# Patient Record
Sex: Male | Born: 1970 | Race: Black or African American | Hispanic: No | Marital: Married | Smoking: Former smoker
Health system: Southern US, Community
[De-identification: ages and names within clinical notes are randomized; demographics above are authoritative.]

## PROBLEM LIST (undated history)

## (undated) DIAGNOSIS — G4730 Sleep apnea, unspecified: Secondary | ICD-10-CM

## (undated) DIAGNOSIS — S8254XD Nondisplaced fracture of medial malleolus of right tibia, subsequent encounter for closed fracture with routine healing: Secondary | ICD-10-CM

## (undated) DIAGNOSIS — M1990 Unspecified osteoarthritis, unspecified site: Secondary | ICD-10-CM

## (undated) DIAGNOSIS — K219 Gastro-esophageal reflux disease without esophagitis: Secondary | ICD-10-CM

## (undated) HISTORY — PX: HAND SURGERY: SHX662

## (undated) HISTORY — PX: EYE SURGERY: SHX253

---

## 2012-04-07 ENCOUNTER — Encounter: Payer: Self-pay | Admitting: Family Medicine

## 2012-04-07 ENCOUNTER — Ambulatory Visit (INDEPENDENT_AMBULATORY_CARE_PROVIDER_SITE_OTHER): Payer: Managed Care, Other (non HMO) | Admitting: Family Medicine

## 2012-04-07 VITALS — BP 132/89 | HR 68 | Temp 97.8°F | Ht 74.0 in | Wt 220.0 lb

## 2012-04-07 DIAGNOSIS — M25579 Pain in unspecified ankle and joints of unspecified foot: Secondary | ICD-10-CM

## 2012-04-07 DIAGNOSIS — M25571 Pain in right ankle and joints of right foot: Secondary | ICD-10-CM | POA: Insufficient documentation

## 2012-04-07 DIAGNOSIS — M25559 Pain in unspecified hip: Secondary | ICD-10-CM

## 2012-04-07 DIAGNOSIS — M25552 Pain in left hip: Secondary | ICD-10-CM

## 2012-04-07 MED ORDER — MELOXICAM 15 MG PO TABS: 15.0000 mg | ORAL_TABLET | Freq: Every day | ORAL | Status: AC

## 2012-04-07 NOTE — Assessment & Plan Note (Signed)
2/2 mild trochanteric bursitis.  Start meloxicam.  Believe once issue #1 is addressed this should improve as well.  Otherwise consider injection, PT or home exercise program.

## 2012-04-07 NOTE — Patient Instructions (Addendum)
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 Treatment for this involves specific strengthening exercises (turning foot in, calf raises - 3 sets of 10 once a day). Also arch support to prevent pronation helps to rest this. Icing 15 minutes at a time 3-4 times a day. Meloxicam 15mg  daily with food for pain and inflammation. Laceup ankle brace may help with support. These things should fix your mechanics and allow your left hip pain to resolve. You're developing mild bursitis of your left hip as a result of walking differently. Follow up with me in 6 weeks for reevaluation.

## 2012-04-07 NOTE — Assessment & Plan Note (Signed)
2/2 post tibial tendinopathy - start home exercise program.  Discussed also buying OTC orthotics - going to consider this vs the ones we stock here.  Start meloxicam, icing.  Tried on ASO but believes he has one at home.  See instructions for further.

## 2012-04-07 NOTE — Progress Notes (Signed)
Subjective:    Patient ID: Chad Hernandez, male    DOB: 30-Apr-1971, 41 y.o.   MRN: [id number]  PCP: [facility]  HPI 41 yo M here for right foot/ankle and left hip pain.  1. Right foot/ankle pain  No known injury though has played basketball for years. States started having medial right ankle pain about a year ago with intermittent swelling. Worse with certain ankle motions. Pain comes and goes - radiates to anterior ankle. Has not tried PT. Has taking tramadol, occasional aleve. Not tried an ankle brace. Decreased motion of ankle. States he's had x-rays before but is unsure what they showed.  2. Left hip pain Started only recently - pain on left lateral thigh/hip especially when lying on this side. Occasional swelling. No bruising. No hip injury. Mild low back pain with this. No numbness, tingling. No radiation past knee. No bowel/bladder dysfunction. Worse when on feet a lot.  History reviewed. No pertinent past medical history.  No current outpatient prescriptions on file prior to visit.    Past Surgical History  Procedure Date  . Hand surgery   . Eye surgery     No Known Allergies  History   Social History  . Marital Status: Single    Spouse Name: N/A    Number of Children: N/A  . Years of Education: N/A   Occupational History  . Not on file.   Social History Main Topics  . Smoking status: Never Smoker   . Smokeless tobacco: Not on file  . Alcohol Use: Not on file  . Drug Use: Not on file  . Sexually Active: Not on file   Other Topics Concern  . Not on file   Social History Narrative  . No narrative on file    Family History  Problem Relation Age of Onset  . Hyperlipidemia Mother   . Hypertension Mother   . Diabetes Neg Hx   . Heart attack Neg Hx   . Sudden death Neg Hx     BP 132/89  Pulse 68  Temp 97.8 F (36.6 C) (Oral)  Ht 6\' 2"  (1.88 m)  Wt 220 lb (99.791 kg)  BMI 28.25 kg/m2  Review of Systems See HPI above.      Objective:   Physical Exam Gen: NAD  R ankle: No gross deformity, swelling, ecchymoses Mild-mod limitation in dorsiflexion.  Otherwise FROM. TTP posterior and inferior to medial malleolus.  No fibular head, navicular, base 5th, medial/lateral malleolus, or other TTP. Negative ant drawer and talar tilt.   Negative syndesmotic compression. Thompsons test negative. NV intact distally. Mild long arch collapse but mostly maintained.  Back/L hip: No gross deformity, scoliosis. TTP mild left greater trochanter and lumbar paraspinal region.  No midline or bony TTP.  No  Right sided TTP. FROM without pain of hip or back. Strength LEs 5/5 all muscle groups.   1+ MSRs in patellar and achilles tendons, equal bilaterally. Negative SLRs. Sensation intact to light touch bilaterally. Negative logroll bilateral hips Negative fabers and piriformis stretches. No leg length inequality. 5/5 hip abduction strength.    Assessment & Plan:  1. Right ankle pain - 2/2 post tibial tendinopathy - start home exercise program.  Discussed also buying OTC orthotics - going to consider this vs the ones we stock here.  Start meloxicam, icing.  Tried on ASO but believes he has one at home.  See instructions for further.  2. Left hip pain - 2/2 mild trochanteric bursitis.  Start meloxicam.  Believe once issue #1 is addressed this should improve as well.  Otherwise consider injection, PT or home exercise program.

## 2012-05-19 ENCOUNTER — Ambulatory Visit: Payer: Managed Care, Other (non HMO) | Admitting: Family Medicine

## 2012-12-22 ENCOUNTER — Ambulatory Visit (INDEPENDENT_AMBULATORY_CARE_PROVIDER_SITE_OTHER): Payer: Managed Care, Other (non HMO) | Admitting: Internal Medicine

## 2012-12-22 ENCOUNTER — Other Ambulatory Visit (INDEPENDENT_AMBULATORY_CARE_PROVIDER_SITE_OTHER): Payer: Managed Care, Other (non HMO)

## 2012-12-22 ENCOUNTER — Encounter: Payer: Self-pay | Admitting: Internal Medicine

## 2012-12-22 VITALS — BP 110/80 | HR 62 | Temp 97.5°F | Resp 16 | Ht 74.0 in | Wt 227.0 lb

## 2012-12-22 DIAGNOSIS — Z0000 Encounter for general adult medical examination without abnormal findings: Secondary | ICD-10-CM

## 2012-12-22 DIAGNOSIS — Z114 Encounter for screening for human immunodeficiency virus [HIV]: Secondary | ICD-10-CM | POA: Insufficient documentation

## 2012-12-22 LAB — TSH: TSH: 1.41 u[IU]/mL (ref 0.35–5.50)

## 2012-12-22 LAB — COMPREHENSIVE METABOLIC PANEL
BUN: 12 mg/dL (ref 6–23)
CO2: 26 mEq/L (ref 19–32)
Creatinine, Ser: 1.1 mg/dL (ref 0.4–1.5)
GFR: 91.72 mL/min (ref >60.00)
Glucose, Bld: 88 mg/dL (ref 70–99)
Sodium: 137 mEq/L (ref 135–145)
Total Bilirubin: 1 mg/dL (ref 0.3–1.2)
Total Protein: 7.5 g/dL (ref 6.0–8.3)

## 2012-12-22 LAB — CBC WITH DIFFERENTIAL/PLATELET
Basophils Relative: 0.3 % (ref 0.0–3.0)
Eosinophils Relative: 2 % (ref 0.0–5.0)
HCT: 44.9 % (ref 39.0–52.0)
Lymphs Abs: 1.9 10*3/uL (ref 0.7–4.0)
Monocytes Relative: 11.3 % (ref 3.0–12.0)
Platelets: 228 10*3/uL (ref 150.0–400.0)
RBC: 4.93 Mil/uL (ref 4.22–5.81)
WBC: 6.4 10*3/uL (ref 4.5–10.5)

## 2012-12-22 LAB — LIPID PANEL
LDL Cholesterol: 88 mg/dL (ref 0–99)
Total CHOL/HDL Ratio: 4
Triglycerides: 49 mg/dL (ref 0.0–149.0)
VLDL: 9.8 mg/dL (ref 0.0–40.0)

## 2012-12-22 LAB — URINALYSIS, ROUTINE W REFLEX MICROSCOPIC
Bilirubin Urine: NEGATIVE
Ketones, ur: NEGATIVE
Leukocytes, UA: NEGATIVE
Urine Glucose: NEGATIVE
pH: 6 (ref 5.0–8.0)

## 2012-12-22 NOTE — Assessment & Plan Note (Signed)
Exam done Vaccines were reviewed Labs ordered Pt ed material was given 

## 2012-12-22 NOTE — Patient Instructions (Signed)
Health Maintenance, Males A healthy lifestyle and preventative care can promote health and wellness.  Maintain regular health, dental, and eye exams.  Eat a healthy diet. Foods like vegetables, fruits, whole grains, low-fat dairy products, and lean protein foods contain the nutrients you need without too many calories. Decrease your intake of foods high in solid fats, added sugars, and salt. Get information about a proper diet from your caregiver, if necessary.  Regular physical exercise is one of the most important things you can do for your health. Most adults should get at least 150 minutes of moderate-intensity exercise (any activity that increases your heart rate and causes you to sweat) each week. In addition, most adults need muscle-strengthening exercises on 2 or more days a week.   Maintain a healthy weight. The body mass index (BMI) is a screening tool to identify possible weight problems. It provides an estimate of body fat based on height and weight. Your caregiver can help determine your BMI, and can help you achieve or maintain a healthy weight. For adults 20 years and older:  A BMI below 18.5 is considered underweight.  A BMI of 18.5 to 24.9 is normal.  A BMI of 25 to 29.9 is considered overweight.  A BMI of 30 and above is considered obese.  Maintain normal blood lipids and cholesterol by exercising and minimizing your intake of saturated fat. Eat a balanced diet with plenty of fruits and vegetables. Blood tests for lipids and cholesterol should begin at age 20 and be repeated every 5 years. If your lipid or cholesterol levels are high, you are over 50, or you are a high risk for heart disease, you may need your cholesterol levels checked more frequently.Ongoing high lipid and cholesterol levels should be treated with medicines, if diet and exercise are not effective.  If you smoke, find out from your caregiver how to quit. If you do not use tobacco, do not start.  If you  choose to drink alcohol, do not exceed 2 drinks per day. One drink is considered to be 12 ounces (355 mL) of beer, 5 ounces (148 mL) of wine, or 1.5 ounces (44 mL) of liquor.  Avoid use of street drugs. Do not share needles with anyone. Ask for help if you need support or instructions about stopping the use of drugs.  High blood pressure causes heart disease and increases the risk of stroke. Blood pressure should be checked at least every 1 to 2 years. Ongoing high blood pressure should be treated with medicines if weight loss and exercise are not effective.  If you are 45 to 44 years old, ask your caregiver if you should take aspirin to prevent heart disease.  Diabetes screening involves taking a blood sample to check your fasting blood sugar level. This should be done once every 3 years, after age 45, if you are within normal weight and without risk factors for diabetes. Testing should be considered at a younger age or be carried out more frequently if you are overweight and have at least 1 risk factor for diabetes.  Colorectal cancer can be detected and often prevented. Most routine colorectal cancer screening begins at the age of 50 and continues through age 75. However, your caregiver may recommend screening at an earlier age if you have risk factors for colon cancer. On a yearly basis, your caregiver may provide home test kits to check for hidden blood in the stool. Use of a small camera at the end of a tube,   to directly examine the colon (sigmoidoscopy or colonoscopy), can detect the earliest forms of colorectal cancer. Talk to your caregiver about this at age 50, when routine screening begins. Direct examination of the colon should be repeated every 5 to 10 years through age 75, unless early forms of pre-cancerous polyps or small growths are found.  Hepatitis C blood testing is recommended for all people born from 1945 through 1965 and any individual with known risks for hepatitis C.  Healthy  men should no longer receive prostate-specific antigen (PSA) blood tests as part of routine cancer screening. Consult with your caregiver about prostate cancer screening.  Testicular cancer screening is not recommended for adolescents or adult males who have no symptoms. Screening includes self-exam, caregiver exam, and other screening tests. Consult with your caregiver about any symptoms you have or any concerns you have about testicular cancer.  Practice safe sex. Use condoms and avoid high-risk sexual practices to reduce the spread of sexually transmitted infections (STIs).  Use sunscreen with a sun protection factor (SPF) of 30 or greater. Apply sunscreen liberally and repeatedly throughout the day. You should seek shade when your shadow is shorter than you. Protect yourself by wearing long sleeves, pants, a wide-brimmed hat, and sunglasses year round, whenever you are outdoors.  Notify your caregiver of new moles or changes in moles, especially if there is a change in shape or color. Also notify your caregiver if a mole is larger than the size of a pencil eraser.  A one-time screening for abdominal aortic aneurysm (AAA) and surgical repair of large AAAs by sound wave imaging (ultrasonography) is recommended for ages 65 to 75 years who are current or former smokers.  Stay current with your immunizations. Document Released: 02/26/2008 Document Revised: 11/22/2011 Document Reviewed: 01/25/2011 [employer] Patient Information 2013 [employer].  

## 2012-12-22 NOTE — Progress Notes (Signed)
  Subjective:    Patient ID: Chad Hernandez, male    DOB: Jan 11, 1971, 42 y.o.   MRN: [id number]  HPI  New to me for a physical, he feels well and offers no complaints.   Review of Systems  Constitutional: Negative.   HENT: Negative.   Eyes: Negative.   Respiratory: Negative.   Cardiovascular: Negative.   Gastrointestinal: Negative.   Endocrine: Negative.   Genitourinary: Negative.   Musculoskeletal: Negative.   Skin: Negative.   Allergic/Immunologic: Negative.   Neurological: Negative.   Hematological: Negative.   Psychiatric/Behavioral: Negative.        Objective:   Physical Exam  Vitals reviewed. Constitutional: He is oriented to person, place, and time. He appears well-developed and well-nourished. No distress.  HENT:  Head: Normocephalic and atraumatic.  Mouth/Throat: Oropharynx is clear and moist. No oropharyngeal exudate.  Eyes: Conjunctivae are normal. Right eye exhibits no discharge. Left eye exhibits no discharge. No scleral icterus.  Neck: Normal range of motion. Neck supple. No JVD present. No tracheal deviation present. No thyromegaly present.  Cardiovascular: Normal rate, regular rhythm, normal heart sounds and intact distal pulses.  Exam reveals no gallop and no friction rub.   No murmur heard. Pulmonary/Chest: Effort normal and breath sounds normal. No stridor. No respiratory distress. He has no wheezes. He has no rales. He exhibits no tenderness.  Abdominal: Soft. Bowel sounds are normal. He exhibits no distension and no mass. There is no tenderness. There is no rebound and no guarding. Hernia confirmed negative in the right inguinal area and confirmed negative in the left inguinal area.  Genitourinary: Rectum normal, prostate normal, testes normal and penis normal. Rectal exam shows no external hemorrhoid, no internal hemorrhoid, no fissure, no mass, no tenderness and anal tone normal. Guaiac negative stool. Prostate is not enlarged and not tender. Right  testis shows no mass, no swelling and no tenderness. Right testis is descended. Left testis shows no mass, no swelling and no tenderness. Left testis is descended. Circumcised. No penile erythema or penile tenderness. No discharge found.  Musculoskeletal: Normal range of motion. He exhibits no edema and no tenderness.  Lymphadenopathy:    He has no cervical adenopathy.       Right: No inguinal adenopathy present.       Left: No inguinal adenopathy present.  Neurological: He is oriented to person, place, and time.  Skin: Skin is warm and dry. No rash noted. He is not diaphoretic. No erythema. No pallor.  Psychiatric: He has a normal mood and affect. His behavior is normal. Judgment and thought content normal.     No results found for this basename: WBC, HGB, HCT, PLT, GLUCOSE, CHOL, TRIG, HDL, LDLDIRECT, LDLCALC, ALT, AST, NA, K, CL, CREATININE, BUN, CO2, TSH, PSA, INR, GLUF, HGBA1C, MICROALBUR       Assessment & Plan:

## 2012-12-25 ENCOUNTER — Encounter: Payer: Self-pay | Admitting: Internal Medicine

## 2013-03-28 ENCOUNTER — Encounter: Payer: Self-pay | Admitting: Internal Medicine

## 2013-03-28 ENCOUNTER — Ambulatory Visit (INDEPENDENT_AMBULATORY_CARE_PROVIDER_SITE_OTHER): Payer: Managed Care, Other (non HMO) | Admitting: Internal Medicine

## 2013-03-28 ENCOUNTER — Ambulatory Visit (INDEPENDENT_AMBULATORY_CARE_PROVIDER_SITE_OTHER)
Admission: RE | Admit: 2013-03-28 | Discharge: 2013-03-28 | Disposition: A | Discharge status: Home / Self Care | Payer: Managed Care, Other (non HMO) | Source: Ambulatory Visit | Attending: Internal Medicine | Admitting: Internal Medicine

## 2013-03-28 VITALS — BP 130/76 | HR 73 | Temp 97.5°F | Resp 16 | Wt 220.0 lb

## 2013-03-28 DIAGNOSIS — M25579 Pain in unspecified ankle and joints of unspecified foot: Secondary | ICD-10-CM

## 2013-03-28 DIAGNOSIS — M24873 Other specific joint derangements of unspecified ankle, not elsewhere classified: Secondary | ICD-10-CM

## 2013-03-28 DIAGNOSIS — M25373 Other instability, unspecified ankle: Secondary | ICD-10-CM | POA: Insufficient documentation

## 2013-03-28 DIAGNOSIS — M25371 Other instability, right ankle: Secondary | ICD-10-CM

## 2013-03-28 DIAGNOSIS — M19079 Primary osteoarthritis, unspecified ankle and foot: Secondary | ICD-10-CM | POA: Insufficient documentation

## 2013-03-28 DIAGNOSIS — M25571 Pain in right ankle and joints of right foot: Secondary | ICD-10-CM

## 2013-03-28 NOTE — Patient Instructions (Signed)

## 2013-03-28 NOTE — Progress Notes (Signed)
  Subjective:    Patient ID: Chad Hernandez, male    DOB: October 19, 1970, 42 y.o.   MRN: [id number]  Ankle Pain  Incident onset: ankle pain off and on for 1 year, he does not remember an injury. The incident occurred at home. There was no injury mechanism. The pain is present in the right ankle. The quality of the pain is described as aching. The pain is at a severity of 2/10. The pain is mild. The pain has been worsening since onset. Pertinent negatives include no inability to bear weight, loss of motion, loss of sensation, muscle weakness, numbness or tingling. He reports no foreign bodies present. The symptoms are aggravated by weight bearing and movement. He has tried NSAIDs for the symptoms. The treatment provided mild relief.      Review of Systems  Constitutional: Negative.   HENT: Negative.   Eyes: Negative.   Respiratory: Negative.   Endocrine: Negative.   Genitourinary: Negative.   Musculoskeletal: Positive for arthralgias.  Skin: Negative.   Allergic/Immunologic: Negative.   Neurological: Negative.  Negative for tingling and numbness.  Hematological: Negative.   Psychiatric/Behavioral: Negative.        Objective:   Physical Exam  Musculoskeletal:       Right ankle: He exhibits normal range of motion, no swelling, no ecchymosis, no deformity, no laceration and normal pulse. Tenderness (over the anterior, medial talus area). No lateral malleolus, no medial malleolus, no AITFL, no CF ligament, no posterior TFL, no head of 5th metatarsal and no proximal fibula tenderness found. Achilles tendon exhibits no pain, no defect and normal Thompson's test results.     Lab Results  Component Value Date   WBC 6.4 12/22/2012   HGB 15.4 12/22/2012   HCT 44.9 12/22/2012   PLT 228.0 12/22/2012   GLUCOSE 88 12/22/2012   CHOL 129 12/22/2012   TRIG 49.0 12/22/2012   HDL 31.10* 12/22/2012   LDLCALC 88 12/22/2012   ALT 29 12/22/2012   AST 23 12/22/2012   NA 137 12/22/2012   K 4.5 12/22/2012   CL  104 12/22/2012   CREATININE 1.1 12/22/2012   BUN 12 12/22/2012   CO2 26 12/22/2012   TSH 1.41 12/22/2012   PSA 0.66 12/22/2012       Assessment & Plan:

## 2013-03-30 ENCOUNTER — Encounter: Payer: Self-pay | Admitting: Internal Medicine

## 2013-03-30 NOTE — Assessment & Plan Note (Signed)
Ortho referral  

## 2013-03-30 NOTE — Assessment & Plan Note (Signed)
Continue nsaids Ortho referral

## 2013-07-19 ENCOUNTER — Other Ambulatory Visit: Payer: Self-pay

## 2013-12-24 ENCOUNTER — Encounter: Payer: Managed Care, Other (non HMO) | Admitting: Internal Medicine

## 2013-12-24 DIAGNOSIS — Z0289 Encounter for other administrative examinations: Secondary | ICD-10-CM

## 2014-06-28 ENCOUNTER — Other Ambulatory Visit: Payer: Self-pay

## 2014-09-27 ENCOUNTER — Encounter: Payer: Managed Care, Other (non HMO) | Admitting: Internal Medicine

## 2014-10-02 ENCOUNTER — Encounter: Payer: Managed Care, Other (non HMO) | Admitting: Internal Medicine

## 2018-09-18 ENCOUNTER — Encounter: Payer: Self-pay | Admitting: Family Medicine

## 2018-09-18 ENCOUNTER — Ambulatory Visit: Payer: Managed Care, Other (non HMO) | Admitting: Family Medicine

## 2018-09-18 ENCOUNTER — Ambulatory Visit (INDEPENDENT_AMBULATORY_CARE_PROVIDER_SITE_OTHER): Payer: BLUE CROSS/BLUE SHIELD | Admitting: Family Medicine

## 2018-09-18 ENCOUNTER — Ambulatory Visit (INDEPENDENT_AMBULATORY_CARE_PROVIDER_SITE_OTHER): Payer: BLUE CROSS/BLUE SHIELD

## 2018-09-18 VITALS — BP 130/78 | HR 97 | Ht 74.0 in | Wt 238.0 lb

## 2018-09-18 DIAGNOSIS — M25571 Pain in right ankle and joints of right foot: Secondary | ICD-10-CM

## 2018-09-18 DIAGNOSIS — Z1211 Encounter for screening for malignant neoplasm of colon: Secondary | ICD-10-CM

## 2018-09-18 DIAGNOSIS — G8929 Other chronic pain: Secondary | ICD-10-CM

## 2018-09-18 DIAGNOSIS — Z0001 Encounter for general adult medical examination with abnormal findings: Secondary | ICD-10-CM | POA: Diagnosis not present

## 2018-09-18 DIAGNOSIS — K2900 Acute gastritis without bleeding: Secondary | ICD-10-CM | POA: Diagnosis not present

## 2018-09-18 DIAGNOSIS — R0683 Snoring: Secondary | ICD-10-CM

## 2018-09-18 MED ORDER — PANTOPRAZOLE SODIUM 40 MG PO TBEC: 40.0000 mg | DELAYED_RELEASE_TABLET | Freq: Every day | ORAL | 3 refills | Status: DC

## 2018-09-18 NOTE — Progress Notes (Signed)
Established Patient Office Visit  Subjective:  Patient ID: Chad Hernandez, male    DOB: [birth year]  Age: 48 y.o. MRN: [id number]  CC:  Chief Complaint  Patient presents with  . Establish Care    HPI Chad Hernandez presents for a complete physical exam and to discuss some medical issues and concerns.  He is accompanied by his wife.  Patient is nonfasting today.  He is a Production designer, theatre/television/film at [employer].  He is on his feet for up to 8 hours daily.  He deals with chronic right ankle pain for years now.  Denies instability.  It sounds as though a fusion has been recommended in his past.  He takes nonsteroidals for his pain on a regular basis.  He does deal with indigestion and reflux.  This is been going on for about 4 months now.  He does have some solid food dysphagia.  Denies increased burping melena or bleeding per rectum.  He does not smoke or use illicit drugs.  Rarely drinks.  He was an athlete in his younger years and played basketball.  Mom died of age 49 from breast cancer.  Father's health history is unknown.  Patient's urine flow is good.  Patient snores loudly and stops breathing according to his wife.  He he feels rested in the morning but does have significant daytime sleepiness.  History reviewed. No pertinent past medical history.  Past Surgical History:  Procedure Laterality Date  . EYE SURGERY    . HAND SURGERY      Family History  Problem Relation Age of Onset  . Hyperlipidemia Mother   . Hypertension Mother   . Diabetes Neg Hx   . Heart attack Neg Hx   . Sudden death Neg Hx   . Cancer Neg Hx   . COPD Neg Hx   . Early death Neg Hx   . Hearing loss Neg Hx   . Heart disease Neg Hx   . Kidney disease Neg Hx   . Stroke Neg Hx   . Hypertension Brother     Social History   Socioeconomic History  . Marital status: Single    Spouse name: Not on file  . Number of children: Not on file  . Years of education: Not on file  . Highest education level: Not on file    Occupational History  . Not on file  Social Needs  . Financial resource strain: Not on file  . Food insecurity:    Worry: Not on file    Inability: Not on file  . Transportation needs:    Medical: Not on file    Non-medical: Not on file  Tobacco Use  . Smoking status: Never Smoker  . Smokeless tobacco: Never Used  Substance and Sexual Activity  . Alcohol use: Yes    Alcohol/week: 3.0 standard drinks    Types: 3 Cans of beer per week  . Drug use: No  . Sexual activity: Yes  Lifestyle  . Physical activity:    Days per week: Not on file    Minutes per session: Not on file  . Stress: Not on file  Relationships  . Social connections:    Talks on phone: Not on file    Gets together: Not on file    Attends religious service: Not on file    Active member of club or organization: Not on file    Attends meetings of clubs or organizations: Not on file    Relationship status:  Not on file  . Intimate partner violence:    Fear of current or ex partner: Not on file    Emotionally abused: Not on file    Physically abused: Not on file    Forced sexual activity: Not on file  Other Topics Concern  . Not on file  Social History Narrative  . Not on file    No outpatient medications prior to visit.   No facility-administered medications prior to visit.     No Known Allergies  ROS Review of Systems  Constitutional: Negative for chills, diaphoresis, fatigue, fever and unexpected weight change.  HENT: Negative.   Eyes: Negative for photophobia and visual disturbance.  Respiratory: Positive for apnea. Negative for shortness of breath and wheezing.   Cardiovascular: Negative.  Negative for chest pain.  Gastrointestinal: Negative.   Endocrine: Negative for polyphagia and polyuria.  Genitourinary: Negative for difficulty urinating, frequency and urgency.  Musculoskeletal: Positive for arthralgias and gait problem.  Skin: Negative for pallor and rash.  Allergic/Immunologic: Negative  for immunocompromised state.  Neurological: Negative for light-headedness and headaches.  Hematological: Does not bruise/bleed easily.  Psychiatric/Behavioral: Negative.       Objective:    Physical Exam  Constitutional: He is oriented to person, place, and time. He appears well-developed and well-nourished. No distress.  HENT:  Head: Normocephalic and atraumatic.  Right Ear: External ear normal.  Left Ear: External ear normal.  Mouth/Throat: Oropharynx is clear and moist. No oropharyngeal exudate.    Eyes: Pupils are equal, round, and reactive to light. Conjunctivae are normal. Right eye exhibits no discharge. Left eye exhibits no discharge. No scleral icterus.  Neck: Neck supple. No JVD present. No tracheal deviation present. No thyromegaly present.  Cardiovascular: Normal rate, regular rhythm and normal heart sounds.  Pulmonary/Chest: Effort normal and breath sounds normal. No stridor.  Abdominal: Soft. Bowel sounds are normal. He exhibits no distension. There is no abdominal tenderness. There is no rebound and no guarding.  Genitourinary: Rectum:     Guaiac result negative.     No rectal mass, anal fissure, tenderness, external hemorrhoid, internal hemorrhoid or abnormal anal tone.  Prostate is enlarged. Prostate is not tender.  Musculoskeletal:     Right ankle: He exhibits decreased range of motion and swelling.       Feet:  Lymphadenopathy:    He has no cervical adenopathy.  Neurological: He is alert and oriented to person, place, and time.  Skin: Skin is warm and dry. He is not diaphoretic.  Psychiatric: He has a normal mood and affect. His behavior is normal.    BP 130/78   Pulse 97   Ht 6\' 2"  (1.88 m)   Wt 238 lb (108 kg)   SpO2 97%   BMI 30.56 kg/m  Wt Readings from Last 3 Encounters:  09/18/18 238 lb (108 kg)  03/28/13 220 lb (99.8 kg)  12/22/12 227 lb (103 kg)   BP Readings from Last 3 Encounters:  09/18/18 130/78  03/28/13 130/76  12/22/12 110/80    Guideline developer:  UpToDate (see UpToDate for funding source) Date Released: June 2014  Health Maintenance Due  Topic Date Due  . HIV Screening  04/06/1984  . TETANUS/TDAP  03/07/2017  . INFLUENZA VACCINE  04/13/2018    There are no preventive care reminders to display for this patient.  Lab Results  Component Value Date   TSH 1.41 12/22/2012   Lab Results  Component Value Date   WBC 6.4 12/22/2012   HGB  15.4 12/22/2012   HCT 44.9 12/22/2012   MCV 91.2 12/22/2012   PLT 228.0 12/22/2012   Lab Results  Component Value Date   NA 137 12/22/2012   K 4.5 12/22/2012   CO2 26 12/22/2012   GLUCOSE 88 12/22/2012   BUN 12 12/22/2012   CREATININE 1.1 12/22/2012   BILITOT 1.0 12/22/2012   ALKPHOS 81 12/22/2012   AST 23 12/22/2012   ALT 29 12/22/2012   PROT 7.5 12/22/2012   ALBUMIN 4.4 12/22/2012   CALCIUM 9.1 12/22/2012   GFR 91.72 12/22/2012   Lab Results  Component Value Date   CHOL 129 12/22/2012   Lab Results  Component Value Date   HDL 31.10 (L) 12/22/2012   Lab Results  Component Value Date   LDLCALC 88 12/22/2012   Lab Results  Component Value Date   TRIG 49.0 12/22/2012   Lab Results  Component Value Date   CHOLHDL 4 12/22/2012   No results found for: HGBA1C    Assessment & Plan:   Problem List Items Addressed This Visit    None    Visit Diagnoses    Encounter for health maintenance examination with abnormal findings    -  Primary   Relevant Orders   CBC   Comprehensive metabolic panel   Lipid panel   PSA   TSH   Urinalysis, Routine w reflex microscopic   Acute gastritis without hemorrhage, unspecified gastritis type       Relevant Medications   pantoprazole (PROTONIX) 40 MG tablet   Other Relevant Orders   Ambulatory referral to Gastroenterology   Snores       Relevant Orders   Ambulatory referral to Sleep Studies   Chronic pain of right ankle       Relevant Orders   Ambulatory referral to Sports Medicine   DG Ankle Complete  Right   Screen for colon cancer       Relevant Orders   Ambulatory referral to Gastroenterology      Meds ordered this encounter  Medications  . pantoprazole (PROTONIX) 40 MG tablet    Sig: Take 1 tablet (40 mg total) by mouth daily.    Dispense:  30 tablet    Refill:  3    Follow-up: Return in about 3 months (around 12/18/2018), or return fasting for blood work. .   Patient agrees to seeing sports medicine for possible insert and referral to orthopedist for arthroscopy of his right ankle.  We will go ahead and start Protonix for what is probably an NSAID induced gastritis.  Go ahead and refer for colonoscopy with EGD.  Sleep study for what sounds like his apnea.  He will return fasting for above ordered blood work.

## 2018-09-18 NOTE — Patient Instructions (Addendum)
Health Maintenance, Male A healthy lifestyle and preventive care is important for your health and wellness. Ask your health care provider about what schedule of regular examinations is right for you. What should I know about weight and diet? Eat a Healthy Diet  Eat plenty of vegetables, fruits, whole grains, low-fat dairy products, and lean protein.  Do not eat a lot of foods high in solid fats, added sugars, or salt.  Maintain a Healthy Weight Regular exercise can help you achieve or maintain a healthy weight. You should:  Do at least 150 minutes of exercise each week. The exercise should increase your heart rate and make you sweat (moderate-intensity exercise).  Do strength-training exercises at least twice a week. Watch Your Levels of Cholesterol and Blood Lipids  Have your blood tested for lipids and cholesterol every 5 years starting at 50 years of age. If you are at high risk for heart disease, you should start having your blood tested when you are 50 years old. You may need to have your cholesterol levels checked more often if: ? Your lipid or cholesterol levels are high. ? You are older than 50 years of age. ? You are at high risk for heart disease. What should I know about cancer screening? Many types of cancers can be detected early and may often be prevented. Lung Cancer  You should be screened every year for lung cancer if: ? You are a current smoker who has smoked for at least 30 years. ? You are a former smoker who has quit within the past 15 years.  Talk to your health care provider about your screening options, when you should start screening, and how often you should be screened. Colorectal Cancer  Routine colorectal cancer screening usually begins at 50 years of age and should be repeated every 5-10 years until you are 50 years old. You may need to be screened more often if early forms of precancerous polyps or small growths are found. Your health care provider may  recommend screening at an earlier age if you have risk factors for colon cancer.  Your health care provider may recommend using home test kits to check for hidden blood in the stool.  A small camera at the end of a tube can be used to examine your colon (sigmoidoscopy or colonoscopy). This checks for the earliest forms of colorectal cancer. Prostate and Testicular Cancer  Depending on your age and overall health, your health care provider may do certain tests to screen for prostate and testicular cancer.  Talk to your health care provider about any symptoms or concerns you have about testicular or prostate cancer. Skin Cancer  Check your skin from head to toe regularly.  Tell your health care provider about any new moles or changes in moles, especially if: ? There is a change in a mole's size, shape, or color. ? You have a mole that is larger than a pencil eraser.  Always use sunscreen. Apply sunscreen liberally and repeat throughout the day.  Protect yourself by wearing long sleeves, pants, a wide-brimmed hat, and sunglasses when outside. What should I know about heart disease, diabetes, and high blood pressure?  If you are 18-39 years of age, have your blood pressure checked every 3-5 years. If you are 40 years of age or older, have your blood pressure checked every year. You should have your blood pressure measured twice-once when you are at a hospital or clinic, and once when you are not at a hospital   or clinic. Record the average of the two measurements. To check your blood pressure when you are not at a hospital or clinic, you can use: ? An automated blood pressure machine at a pharmacy. ? A home blood pressure monitor.  Talk to your health care provider about your target blood pressure.  If you are between 61-44 years old, ask your health care provider if you should take aspirin to prevent heart disease.  Have regular diabetes screenings by checking your fasting blood sugar  level. ? If you are at a normal weight and have a low risk for diabetes, have this test once every three years after the age of 37. ? If you are overweight and have a high risk for diabetes, consider being tested at a younger age or more often.  A one-time screening for abdominal aortic aneurysm (AAA) by ultrasound is recommended for men aged 65-75 years who are current or former smokers. What should I know about preventing infection? Hepatitis B If you have a higher risk for hepatitis B, you should be screened for this virus. Talk with your health care provider to find out if you are at risk for hepatitis B infection. Hepatitis C Blood testing is recommended for:  Everyone born from [birth year] through 1967.  Anyone with known risk factors for hepatitis C. Sexually Transmitted Diseases (STDs)  You should be screened each year for STDs including gonorrhea and chlamydia if: ? You are sexually active and are younger than 50 years of age. ? You are older than 50 years of age and your health care provider tells you that you are at risk for this type of infection. ? Your sexual activity has changed since you were last screened and you are at an increased risk for chlamydia or gonorrhea. Ask your health care provider if you are at risk.  Talk with your health care provider about whether you are at high risk of being infected with HIV. Your health care provider may recommend a prescription medicine to help prevent HIV infection. What else can I do?  Schedule regular health, dental, and eye exams.  Stay current with your vaccines (immunizations).  Do not use any tobacco products, such as cigarettes, chewing tobacco, and e-cigarettes. If you need help quitting, ask your health care provider.  Limit alcohol intake to no more than 2 drinks per day. One drink equals 12 ounces of beer, 5 ounces of wine, or 1 ounces of hard liquor.  Do not use street drugs.  Do not share needles.  Ask your health  care provider for help if you need support or information about quitting drugs.  Tell your health care provider if you often feel depressed.  Tell your health care provider if you have ever been abused or do not feel safe at home. This information is not intended to replace advice given to you by your health care provider. Make sure you discuss any questions you have with your health care provider. Document Released: 02/26/2008 Document Revised: 04/28/2016 Document Reviewed: 06/03/2015 Elsevier Interactive Patient Education  2019 [employer]  Gastritis, Adult Gastritis is inflammation of the stomach. There are two kinds of gastritis:  Acute gastritis. This kind develops suddenly.  Chronic gastritis. This kind is much more common and lasts for a long time. Gastritis happens when the lining of the stomach becomes weak or gets damaged. Without treatment, gastritis can lead to stomach bleeding and ulcers. What are the causes? This condition may be caused by:  An infection.  Drinking too much alcohol.  Certain medicines. These include steroids, antibiotics, and some over-the-counter medicines, such as aspirin or ibuprofen.  Having too much acid in the stomach.  A disease of the intestines or stomach.  Stress.  An allergic reaction.  Crohn's disease.  Some cancer treatments (radiation). Sometimes the cause of this condition is not known. What are the signs or symptoms? Symptoms of this condition include:  Pain or a burning sensation in the upper abdomen.  Nausea.  Vomiting.  An uncomfortable feeling of fullness after eating.  Weight loss.  Bad breath.  Blood in your vomit or stools. In some cases, there are no symptoms. How is this diagnosed? This condition may be diagnosed with:  Your medical history and a description of your symptoms.  A physical exam.  Tests. These can include: ? Blood tests. ? Stool tests. ? A test in which a thin, flexible instrument  with a light and a camera is passed down the esophagus and into the stomach (upper endoscopy). ? A test in which a sample of tissue is taken for testing (biopsy). How is this treated? This condition may be treated with medicines. The medicines that are used vary depending on the cause of the gastritis:  If the condition is caused by a bacterial infection, you may be given antibiotic medicines.  If the condition is caused by too much acid in the stomach, you may be given medicines called H2 blockers, proton pump inhibitors, or antacids. Treatment may also involve stopping the use of certain medicines, such as aspirin, ibuprofen, or other NSAIDs. Follow these instructions at home: Medicines  Take over-the-counter and prescription medicines only as told by your health care provider.  If you were prescribed an antibiotic medicine, take it as told by your health care provider. Do not stop taking the antibiotic even if you start to feel better. Eating and drinking   Eat small, frequent meals instead of large meals.  Avoid foods and drinks that make your symptoms worse.  Drink enough fluid to keep your urine pale yellow. Alcohol use  Do not drink alcohol if: ? Your health care provider tells you not to drink. ? You are pregnant, may be pregnant, or are planning to become pregnant.  If you drink alcohol: ? Limit your use to:  0-1 drink a day for women.  0-2 drinks a day for men. ? Be aware of how much alcohol is in your drink. In the [place] one drink equals one 12 oz bottle of beer (355 mL), one 5 oz glass of wine (148 mL), or one 1 oz glass of hard liquor (44 mL). General instructions  Talk with your health care provider about ways to manage stress, such as getting regular exercise or practicing deep breathing, meditation, or yoga.  Do not use any products that contain nicotine or tobacco, such as cigarettes and e-cigarettes. If you need help quitting, ask your health care  provider.  Keep all follow-up visits as told by your health care provider. This is important. Contact a health care provider if:  Your symptoms get worse.  Your symptoms return after treatment. Get help right away if:  You vomit blood or material that looks like coffee grounds.  You have black or dark red stools.  You are unable to keep fluids down.  Your abdominal pain gets worse.  You have a fever.  You do not feel better after one week. Summary  Gastritis is inflammation of the lining of the stomach  that can occur suddenly (acute) or develop slowly over time (chronic).  This condition is diagnosed with a medical history, a physical exam, or tests.  This condition may be treated with medicines to treat infection or medicines to reduce the amount of acid in your stomach.  Follow your health care provider's instructions about taking medicines, making changes to your diet, and knowing when to call for help. This information is not intended to replace advice given to you by your health care provider. Make sure you discuss any questions you have with your health care provider. Document Released: 08/24/2001 Document Revised: 01/17/2018 Document Reviewed: 01/17/2018 Elsevier Interactive Patient Education  2019 [employer]  Sleep Apnea Sleep apnea is a condition in which breathing pauses or becomes shallow during sleep. Episodes of sleep apnea usually last 10 seconds or longer, and they may occur as many as 20 times an hour. Sleep apnea disrupts your sleep and keeps your body from getting the rest that it needs. This condition can increase your risk of certain health problems, including:  Heart attack.  Stroke.  Obesity.  Diabetes.  Heart failure.  Irregular heartbeat. There are three kinds of sleep apnea:  Obstructive sleep apnea. This kind is caused by a blocked or collapsed airway.  Central sleep apnea. This kind happens when the part of the brain that controls  breathing does not send the correct signals to the muscles that control breathing.  Mixed sleep apnea. This is a combination of obstructive and central sleep apnea. What are the causes? The most common cause of this condition is a collapsed or blocked airway. An airway can collapse or become blocked if:  Your throat muscles are abnormally relaxed.  Your tongue and tonsils are larger than normal.  You are overweight.  Your airway is smaller than normal. What increases the risk? This condition is more likely to develop in people who:  Are overweight.  Smoke.  Have a smaller than normal airway.  Are elderly.  Are male.  Drink alcohol.  Take sedatives or tranquilizers.  Have a family history of sleep apnea. What are the signs or symptoms? Symptoms of this condition include:  Trouble staying asleep.  Daytime sleepiness and tiredness.  Irritability.  Loud snoring.  Morning headaches.  Trouble concentrating.  Forgetfulness.  Decreased interest in sex.  Unexplained sleepiness.  Mood swings.  Personality changes.  Feelings of depression.  Waking up often during the night to urinate.  Dry mouth.  Sore throat. How is this diagnosed? This condition may be diagnosed with:  A medical history.  A physical exam.  A series of tests that are done while you are sleeping (sleep study). These tests are usually done in a sleep lab, but they may also be done at home. How is this treated? Treatment for this condition aims to restore normal breathing and to ease symptoms during sleep. It may involve managing health issues that can affect breathing, such as high blood pressure or obesity. Treatment may include:  Sleeping on your side.  Using a decongestant if you have nasal congestion.  Avoiding the use of depressants, including alcohol, sedatives, and narcotics.  Losing weight if you are overweight.  Making changes to your diet.  Quitting smoking.  Using a  device to open your airway while you sleep, such as: ? An oral appliance. This is a custom-made mouthpiece that shifts your lower jaw forward. ? A continuous positive airway pressure (CPAP) device. This device delivers oxygen to your airway through a  mask. ? A nasal expiratory positive airway pressure (EPAP) device. This device has valves that you put into each nostril. ? A bi-level positive airway pressure (BPAP) device. This device delivers oxygen to your airway through a mask.  Surgery if other treatments do not work. During surgery, excess tissue is removed to create a wider airway. It is important to get treatment for sleep apnea. Without treatment, this condition can lead to:  High blood pressure.  Coronary artery disease.  (Men) An inability to achieve or maintain an erection (impotence).  Reduced thinking abilities. Follow these instructions at home:  Make any lifestyle changes that your health care provider recommends.  Eat a healthy, well-balanced diet.  Take over-the-counter and prescription medicines only as told by your health care provider.  Avoid using depressants, including alcohol, sedatives, and narcotics.  Take steps to lose weight if you are overweight.  If you were given a device to open your airway while you sleep, use it only as told by your health care provider.  Do not use any tobacco products, such as cigarettes, chewing tobacco, and e-cigarettes. If you need help quitting, ask your health care provider.  Keep all follow-up visits as told by your health care provider. This is important. Contact a health care provider if:  The device that you received to open your airway during sleep is uncomfortable or does not seem to be working.  Your symptoms do not improve.  Your symptoms get worse. Get help right away if:  You develop chest pain.  You develop shortness of breath.  You develop discomfort in your back, arms, or stomach.  You have trouble  speaking.  You have weakness on one side of your body.  You have drooping in your face. These symptoms may represent a serious problem that is an emergency. Do not wait to see if the symptoms will go away. Get medical help right away. Call your local emergency services (911 in the [place] Do not drive yourself to the hospital. This information is not intended to replace advice given to you by your health care provider. Make sure you discuss any questions you have with your health care provider. Document Released: 08/20/2002 Document Revised: 03/28/2017 Document Reviewed: 06/09/2015 Elsevier Interactive Patient Education  2019 [employer].

## 2018-09-26 ENCOUNTER — Other Ambulatory Visit (INDEPENDENT_AMBULATORY_CARE_PROVIDER_SITE_OTHER): Payer: BLUE CROSS/BLUE SHIELD

## 2018-09-26 ENCOUNTER — Ambulatory Visit: Payer: BLUE CROSS/BLUE SHIELD | Admitting: Family Medicine

## 2018-09-26 ENCOUNTER — Encounter: Payer: Self-pay | Admitting: Family Medicine

## 2018-09-26 VITALS — BP 130/90 | HR 75 | Temp 98.1°F | Ht 74.0 in | Wt 238.0 lb

## 2018-09-26 DIAGNOSIS — Z0001 Encounter for general adult medical examination with abnormal findings: Secondary | ICD-10-CM | POA: Diagnosis not present

## 2018-09-26 DIAGNOSIS — M19071 Primary osteoarthritis, right ankle and foot: Secondary | ICD-10-CM | POA: Diagnosis not present

## 2018-09-26 LAB — LIPID PANEL
Cholesterol: 138 mg/dL (ref 0–200)
HDL: 37 mg/dL — ABNORMAL LOW (ref >39.00)
LDL Cholesterol: 91 mg/dL (ref 0–99)
NonHDL: 101.34
Total CHOL/HDL Ratio: 4
Triglycerides: 54 mg/dL (ref 0.0–149.0)
VLDL: 10.8 mg/dL (ref 0.0–40.0)

## 2018-09-26 LAB — COMPREHENSIVE METABOLIC PANEL
ALT: 31 U/L (ref 0–53)
AST: 21 U/L (ref 0–37)
Albumin: 4.5 g/dL (ref 3.5–5.2)
Alkaline Phosphatase: 96 U/L (ref 39–117)
BILIRUBIN TOTAL: 0.5 mg/dL (ref 0.2–1.2)
BUN: 13 mg/dL (ref 6–23)
CO2: 25 mEq/L (ref 19–32)
Calcium: 9.2 mg/dL (ref 8.4–10.5)
Chloride: 106 mEq/L (ref 96–112)
Creatinine, Ser: 1.06 mg/dL (ref 0.40–1.50)
GFR: 95.31 mL/min (ref >60.00)
Glucose, Bld: 93 mg/dL (ref 70–99)
Potassium: 4.1 mEq/L (ref 3.5–5.1)
Sodium: 139 mEq/L (ref 135–145)
Total Protein: 7.6 g/dL (ref 6.0–8.3)

## 2018-09-26 LAB — URINALYSIS, ROUTINE W REFLEX MICROSCOPIC
Bilirubin Urine: NEGATIVE
Hgb urine dipstick: NEGATIVE
KETONES UR: NEGATIVE
Leukocytes, UA: NEGATIVE
Nitrite: NEGATIVE
RBC / HPF: NONE SEEN (ref 0–?)
Specific Gravity, Urine: 1.02 (ref 1.000–1.030)
Total Protein, Urine: NEGATIVE
UROBILINOGEN UA: 0.2 (ref 0.0–1.0)
Urine Glucose: NEGATIVE
pH: 6 (ref 5.0–8.0)

## 2018-09-26 LAB — TSH: TSH: 1.55 u[IU]/mL (ref 0.35–4.50)

## 2018-09-26 LAB — CBC
HCT: 44.9 % (ref 39.0–52.0)
Hemoglobin: 15.3 g/dL (ref 13.0–17.0)
MCHC: 34 g/dL (ref 30.0–36.0)
MCV: 92.3 fl (ref 78.0–100.0)
Platelets: 201 10*3/uL (ref 150.0–400.0)
RBC: 4.87 Mil/uL (ref 4.22–5.81)
RDW: 13.8 % (ref 11.5–15.5)
WBC: 5.7 10*3/uL (ref 4.0–10.5)

## 2018-09-26 LAB — PSA: PSA: 1.06 ng/mL (ref 0.10–4.00)

## 2018-09-26 MED ORDER — DICLOFENAC SODIUM 2 % TD SOLN: 1.0000 "application " | Freq: Two times a day (BID) | TRANSDERMAL | 3 refills | Status: DC

## 2018-09-26 MED ORDER — IBUPROFEN-FAMOTIDINE 800-26.6 MG PO TABS: 1.0000 | ORAL_TABLET | Freq: Three times a day (TID) | ORAL | 3 refills | Status: DC

## 2018-09-26 NOTE — Patient Instructions (Signed)
Nice to meet you  Please try the medication  Please try ice if needed  Take tylenol 650 mg three times a day is the best evidence based medicine we have for arthritis.  Glucosamine sulfate 750mg  twice a day is a supplement that has been shown to help moderate to severe arthritis. Vitamin D 2000 IU daily Fish oil 2 grams daily.  Tumeric 500mg  twice daily.  Capsaicin topically up to four times a day may also help with pain. Marland Kitchen

## 2018-09-26 NOTE — Assessment & Plan Note (Signed)
Likely that his pain is related to the significant degenerative changes observed on the x-ray.  Conservative therapy is limited based to the extent of his damage. -Referral to Ortho -Provided Duexis and Pennsaid. -Can follow-up as needed.

## 2018-09-26 NOTE — Progress Notes (Signed)
Chad Hernandez - 48 y.o. male MRN [id number]  Date of birth: Aug 10, 1971  SUBJECTIVE:  Including CC & ROS.  Chief Complaint  Patient presents with  . Ankle Pain    right    Chad Hernandez is a 48 y.o. male that is presenting with acute on chronic right ankle pain.  He feels the pain over the medial malleolus.  This pain is constant and severe.  It is localized to the ankle itself.  He denies any specific injury or inciting event.  He is tried several things in the past with limited improvement.  This affects him on a daily basis.  He is walking differently and now his left hip is hurting.  Denies any fevers or chills.  No history of any autoimmune process.  Independent review of the right ankle x-ray from 1/6 shows significant degenerative changes of the ankle joint.   Review of Systems  Constitutional: Negative for fever.  HENT: Negative for congestion.   Respiratory: Negative for cough.   Cardiovascular: Negative for chest pain.  Gastrointestinal: Negative for abdominal pain.  Musculoskeletal: Positive for arthralgias and gait problem.  Skin: Negative for color change.  Neurological: Negative for weakness.  Hematological: Negative for adenopathy.  Psychiatric/Behavioral: Negative for agitation.    HISTORY: Past Medical, Surgical, Social, and Family History Reviewed & Updated per EMR.   Pertinent Historical Findings include:  No past medical history on file.  Past Surgical History:  Procedure Laterality Date  . EYE SURGERY    . HAND SURGERY      No Known Allergies  Family History  Problem Relation Age of Onset  . Hyperlipidemia Mother   . Hypertension Mother   . Diabetes Neg Hx   . Heart attack Neg Hx   . Sudden death Neg Hx   . Cancer Neg Hx   . COPD Neg Hx   . Early death Neg Hx   . Hearing loss Neg Hx   . Heart disease Neg Hx   . Kidney disease Neg Hx   . Stroke Neg Hx   . Hypertension Brother      Social History   Socioeconomic History  . Marital  status: Single    Spouse name: Not on file  . Number of children: Not on file  . Years of education: Not on file  . Highest education level: Not on file  Occupational History  . Not on file  Social Needs  . Financial resource strain: Not on file  . Food insecurity:    Worry: Not on file    Inability: Not on file  . Transportation needs:    Medical: Not on file    Non-medical: Not on file  Tobacco Use  . Smoking status: Never Smoker  . Smokeless tobacco: Never Used  Substance and Sexual Activity  . Alcohol use: Yes    Alcohol/week: 3.0 standard drinks    Types: 3 Cans of beer per week  . Drug use: No  . Sexual activity: Yes  Lifestyle  . Physical activity:    Days per week: Not on file    Minutes per session: Not on file  . Stress: Not on file  Relationships  . Social connections:    Talks on phone: Not on file    Gets together: Not on file    Attends religious service: Not on file    Active member of club or organization: Not on file    Attends meetings of clubs or organizations: Not  on file    Relationship status: Not on file  . Intimate partner violence:    Fear of current or ex partner: Not on file    Emotionally abused: Not on file    Physically abused: Not on file    Forced sexual activity: Not on file  Other Topics Concern  . Not on file  Social History Narrative  . Not on file     PHYSICAL EXAM:  VS: BP 130/90   Pulse 75   Temp 98.1 F (36.7 C) (Oral)   Ht 6\' 2"  (1.88 m)   Wt 238 lb (108 kg)   SpO2 98%   BMI 30.56 kg/m  Physical Exam Gen: NAD, alert, cooperative with exam, well-appearing ENT: normal lips, normal nasal mucosa,  Eye: normal EOM, normal conjunctiva and lids CV:  no edema, +2 pedal pulses   Resp: no accessory muscle use, non-labored,  Skin: no rashes, no areas of induration  Neuro: normal tone, normal sensation to touch Psych:  normal insight, alert and oriented MSK:  Right ankle:  Tenderness to palpation over the medial  malleolus. Limited range of motion  No obvious effusion or ecchymosis. No tenderness to palpation over the lateral malleolus. Neurovascular intact      ASSESSMENT & PLAN:   OA (osteoarthritis) of ankle Likely that his pain is related to the significant degenerative changes observed on the x-ray.  Conservative therapy is limited based to the extent of his damage. -Referral to Ortho -Provided Duexis and Pennsaid. -Can follow-up as needed.

## 2018-10-30 ENCOUNTER — Encounter: Payer: Self-pay | Admitting: Neurology

## 2018-10-30 ENCOUNTER — Ambulatory Visit: Payer: BLUE CROSS/BLUE SHIELD | Admitting: Neurology

## 2018-10-30 VITALS — BP 143/88 | HR 82 | Ht 74.0 in | Wt 236.0 lb

## 2018-10-30 DIAGNOSIS — G4710 Hypersomnia, unspecified: Secondary | ICD-10-CM

## 2018-10-30 DIAGNOSIS — G4719 Other hypersomnia: Secondary | ICD-10-CM | POA: Diagnosis not present

## 2018-10-30 DIAGNOSIS — R0683 Snoring: Secondary | ICD-10-CM

## 2018-10-30 DIAGNOSIS — G4730 Sleep apnea, unspecified: Secondary | ICD-10-CM

## 2018-10-30 DIAGNOSIS — M19071 Primary osteoarthritis, right ankle and foot: Secondary | ICD-10-CM | POA: Diagnosis not present

## 2018-10-30 NOTE — Progress Notes (Signed)
SLEEP MEDICINE CLINIC   Provider:  Melvyn Novasarmen  Marcell Pfeifer, MD   Primary Care Physician:  Mliss SaxKremer, William Alfred, MD   Referring Provider: Josem KaufmannKremer, William Alfred,* [place], [place]   Chief Complaint  Patient presents with  . New Patient (Initial Visit)    pt alone, rm 11. pt states his notices shortness of breath going up stairs. his wife states he snores and has witnessed apnea events. pt states that he feels like he sleeps good, wakes up to use the bathroom.  he admits to falling asleep watching tv. never had     HPI:  Chad Hernandez,  is a 48 y.o. male patient , seen here in a referral from Dr. Doreene BurkeKremer for a sleep consultation on 10-30-2018.  Mr. Chad Hernandez is a married father of nine children and reports that he was actually referred upon request by his spouse. His wife has been concerned about snoring and has witnessed apneic pauses in his breathing.   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 He is a very early sleeper, and goes to bed around 7:30 PM after finishing work and having his own dinner.  The bedroom is 73 degrees, it is quiet and dark- he switches the TV off.  He sleeps on a flat mattress, one pillow.  He dreams, has nocturia times one.  He rises at 4 AM- and usually feels refreshed.  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 He takes naps of one hour duration  in the afternoons and on days off.   Sleep medical history : no history of HTN, nor DM.   Family sleep history: no family member diagnosed with OSA, but all six brothers snore.    Social history:  He grow up as the oldest of 6 brother and one sister. Married, manages a [employer], has nine children.  Non smoker, drinks some ETOH- beer 5 a week.Coffee all day.   Review of Systems: Out of a complete 14 system review, the patient complains of only the following symptoms, and all other reviewed systems are  negative.  Snoring, daytime naps,   Epworth score: 13/ 24  , Fatigue severity score 9/ 63   , depression ; N/A   Social History   Socioeconomic History  . Marital status: Single    Spouse name: Not on file  . Number of children: Not on file  . Years of education: Not on file  . Highest education level: Not on file  Occupational History  . Not on file  Social Needs  . Financial resource strain: Not on file  . Food insecurity:    Worry: Not on file    Inability: Not on file  . Transportation needs:    Medical: Not on file    Non-medical: Not on file  Tobacco Use  . Smoking status: Never Smoker  . Smokeless tobacco: Never Used  Substance and Sexual Activity  . Alcohol use: Yes    Alcohol/week: 3.0 standard drinks    Types: 3 Cans of beer per week  . Drug use: No  . Sexual activity: Yes  Lifestyle  . Physical activity:    Days per week: Not on file    Minutes per session: Not on file  . Stress: Not on file  Relationships  . Social connections:    Talks on phone: Not on file  Gets together: Not on file    Attends religious service: Not on file    Active member of club or organization: Not on file    Attends meetings of clubs or organizations: Not on file    Relationship status: Not on file  . Intimate partner violence:    Fear of current or ex partner: Not on file    Emotionally abused: Not on file    Physically abused: Not on file    Forced sexual activity: Not on file  Other Topics Concern  . Not on file  Social History Narrative  . Not on file    Family History  Problem Relation Age of Onset  . Hyperlipidemia Mother   . Hypertension Mother   . Breast cancer Mother   . Hypertension Brother   . Diabetes Neg Hx   . Heart attack Neg Hx   . Sudden death Neg Hx   . Cancer Neg Hx   . COPD Neg Hx   . Early death Neg Hx   . Hearing loss Neg Hx   . Heart disease Neg Hx   . Kidney disease Neg Hx   . Stroke Neg Hx      Past Surgical History:    Procedure Laterality Date  . EYE SURGERY    . HAND SURGERY      Current Outpatient Medications  Medication Sig Dispense Refill  . Diclofenac Sodium (PENNSAID) 2 % SOLN Place 1 application onto the skin 2 (two) times daily. 1 Bottle 3  . Ibuprofen-Famotidine 800-26.6 MG TABS Take 1 tablet by mouth 3 (three) times daily. 90 tablet 3  . pantoprazole (PROTONIX) 40 MG tablet Take 1 tablet (40 mg total) by mouth daily. 30 tablet 3   No current facility-administered medications for this visit.     Allergies as of 10/30/2018  . (No Known Allergies)    Vitals: BP (!) 143/88   Pulse 82   Ht 6\' 2"  (1.88 m)   Wt 236 lb (107 kg)   BMI 30.30 kg/m  Last Weight:  Wt Readings from Last 1 Encounters:  10/30/18 236 lb (107 kg)   VQM:GQQP mass index is 30.3 kg/m.     Last Height:   Ht Readings from Last 1 Encounters:  10/30/18 6\' 2"  (1.88 m)    Physical exam:  General: The patient is awake, alert and appears not in acute distress. The patient is well groomed. Head: Normocephalic, atraumatic. Neck is supple. Mallampati: 3  neck circumference: 18. 5 . Nasal airflow: patent , but there is a significant nasal deviation noted - he denies any history of fracture or injury.   TMJ click  is not evident . Retrognathia is seen.  Cardiovascular:  Regular rate and rhythm , without  murmurs or carotid bruit, and without distended neck veins. Respiratory: Lungs are clear to auscultation. Skin:  Without evidence of edema, or rash Trunk: BMI is 30 in clothes . The patient's posture is erect.   Neurologic exam : The patient is awake and alert, oriented to place and time.   Attention span & concentration ability appears normal.  Speech is fluent, without dysarthria, dysphonia or aphasia.  Mood and affect are appropriate.  Cranial nerves: Pupils are equal and briskly reactive to light. Extraocular movements  in vertical and horizontal planes intact and without nystagmus. Visual fields by finger  perimetry are intact. Hearing to finger rub intact.  He has a scar that distorted the right eyelid but doesn't affect vision. Facial  sensation intact to fine touch. Facial motor strength is symmetric and tongue and uvula move midline. Shoulder shrug is symmetrical.   Motor exam:   Normal tone, muscle bulk and symmetric strength in all extremities. 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   Sensory:  Fine touch, pinprick and vibration were tested and normal in all extremities. Proprioception tested in the upper extremities was normal.  Coordination: Rapid alternating movements in the fingers/hands was normal. Finger-to-nose maneuver normal without evidence of ataxia, dysmetria or tremor.  Gait and station: Patient walks without assistive device . Stance is stable and normal.   .Turns with 3 Steps. Romberg testing is negative.  Deep tendon reflexes: in the  upper and lower extremities are symmetric and intact. Babinski maneuver response is  downgoing.  Assessment:  After physical and neurologic examination, review of laboratory studies,  Personal review of imaging studies, reports of other /same  Imaging studies, results of polysomnography and / or neurophysiology testing and pre-existing records as far as provided in visit., my assessment is :  1) Mr. Breuninger is here primarily because his wife is concerned about witnessing apnea and snoring.  He also endorsed an elevated level of daytime sleepiness and he is ready for a nap each afternoon.  He also is early to bed and early to rise but allocates enough time for restful sleep.  He himself has not reported any kind of signs of physiologic stress from apnea such as waking up diaphoretic or with palpitations and he does not have headaches.  At this time my goal would be to screen him for the presence of apnea and I will do this with a home sleep test since he lacks any kind of comorbidities.  The patient was advised of the nature  of the diagnosed disorder, the treatment options and the  risks for general health and wellness arising from not treating the condition.   I spent more than 30 minutes of face to face time with the patient. Greater than 50% of time was spent in counseling and coordination of care. We have discussed the diagnosis and differential and I answered the patient's questions.    Plan:  Treatment plan and additional workup :  HST - if positive for sleep apnea will treat accordingly.    Melvyn Novas, MD 10/30/2018, 11:31 AM  Certified in Neurology by ABPN Certified in Sleep Medicine by [facility] [address], [address] [place], [place] [address]

## 2018-11-06 ENCOUNTER — Ambulatory Visit: Payer: BLUE CROSS/BLUE SHIELD | Admitting: Neurology

## 2018-11-06 DIAGNOSIS — R0683 Snoring: Secondary | ICD-10-CM

## 2018-11-06 DIAGNOSIS — G4710 Hypersomnia, unspecified: Secondary | ICD-10-CM | POA: Diagnosis not present

## 2018-11-06 DIAGNOSIS — G4730 Sleep apnea, unspecified: Secondary | ICD-10-CM

## 2018-11-06 DIAGNOSIS — M19071 Primary osteoarthritis, right ankle and foot: Secondary | ICD-10-CM

## 2018-11-06 DIAGNOSIS — G4719 Other hypersomnia: Secondary | ICD-10-CM

## 2018-11-08 DIAGNOSIS — G4719 Other hypersomnia: Secondary | ICD-10-CM | POA: Insufficient documentation

## 2018-11-08 DIAGNOSIS — R0683 Snoring: Secondary | ICD-10-CM | POA: Insufficient documentation

## 2018-11-08 DIAGNOSIS — G4710 Hypersomnia, unspecified: Secondary | ICD-10-CM | POA: Insufficient documentation

## 2018-11-08 DIAGNOSIS — G4730 Sleep apnea, unspecified: Secondary | ICD-10-CM

## 2018-11-08 NOTE — Procedures (Signed)
NAME:  [name], V                                                                           DOB: May 17, 1971 MEDICAL RECORD No:   [id number]                                                      DOS: 11/06/2018 REFERRING PHYSICIAN: Mliss Sax, MD STUDY PERFORMED: Home Sleep Test on [facility] HISTORY:  Chad Hernandez  is a 48 y.o. male patient , seen here in a referral from Dr. Doreene Burke for a sleep consultation on 10-30-2018.  [name] is a married father of nine children and reports that he was actually referred upon request by his spouse. His wife has been concerned about snoring and has witnessed apneic pauses in his breathing.   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 He is a very early sleeper and goes to bed around 7:30 PM after finishing work and having his own dinner. The bedroom is kept at a temperature of 73 degrees, it is quiet and dark- he switches the TV off. He sleeps on a flat mattress, one pillow.  He dreams, has nocturia times one. He rises at 4 AM- and usually feels refreshed. 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 He takes naps of one hour duration in the afternoons and on days off.     Epworth Sleepiness score: 13/ 24 points, Fatigue severity score 9/ 63 points, BMI: 30.3kg/m2  STUDY RESULTS:  Total Recording Time: 7 h 56 mins; Total Sleep Time:   5 h 18 mins Total Apnea/Hypopnea Index (AHI):  6.9 /h; RDI:  10.5/h; REM AHI:  6.3/h Average Oxygen Saturation:  96 %; Lowest Oxygen Desaturation:   88%  Total Time in Oxygen Saturation below 89 %: 0.1 minutes  Average Heart Rate: 76 bpm (between 48 and 110 bpm) IMPRESSION: Moderate snoring and mild OSA (Obstructive Sleep Apnea) are noted, without hypoxemia. No REM sleep accentuation is seen.  RECOMMENDATION: This mild, uncomplicated form of apnea can be treated with a dental device that would also reduce  snoring. CPAP intervention is optional.  Weight loss and avoiding supine sleep position will also assist in apnea reduction.  I certify that I have reviewed the raw data recording prior to the issuance of this report in accordance with the standards of the American Academy of Sleep Medicine (AASM). Melvyn Novas, M.D.   11-08-2018   Medical Director of [facility], accredited by the AASM. Diplomat of the ABPN and ABSM.

## 2018-11-13 ENCOUNTER — Encounter: Payer: Self-pay | Admitting: Neurology

## 2018-11-13 ENCOUNTER — Telehealth: Payer: Self-pay | Admitting: Neurology

## 2018-11-13 NOTE — Telephone Encounter (Signed)
Called patient to discuss sleep study results. No answer at this time. LVM for the patient to call back.   

## 2018-11-13 NOTE — Telephone Encounter (Signed)
-----   Message from Melvyn Novas, MD sent at 11/08/2018  5:03 PM EST ----- IMPRESSION: Moderate snoring and mild OSA (Obstructive Sleep  Apnea) are noted, without hypoxemia. No REM sleep accentuation is  seen. Sleep was fragmented ( many arousals). RECOMMENDATION: This mild, uncomplicated form of apnea can be  treated with a dental device that would also reduce snoring. CPAP  intervention is optional, but may bring the fasted results.  Weight loss and avoiding supine sleep position will also assist  in apnea reduction.  Please let patient decide among these options and make RV as needed.

## 2018-11-13 NOTE — Telephone Encounter (Signed)
Patient called back and I was able to review his sleep study and go over results with him. Advised him of the recommendations and options that were offered to help with treatment of apnea. Patient has opted to work on weight loss and avoid sleeping on his back for now to help with treatment of apnea. Advised the patient to inform us if he has any questions or concerns. Pt verbalized understanding. Pt had no questions at this time but was encouraged to call back if questions arise.

## 2018-12-13 ENCOUNTER — Other Ambulatory Visit: Payer: Self-pay | Admitting: Family Medicine

## 2018-12-13 DIAGNOSIS — M19071 Primary osteoarthritis, right ankle and foot: Secondary | ICD-10-CM

## 2019-01-05 ENCOUNTER — Other Ambulatory Visit: Payer: Self-pay | Admitting: Family Medicine

## 2019-01-05 DIAGNOSIS — M19071 Primary osteoarthritis, right ankle and foot: Secondary | ICD-10-CM

## 2019-03-28 ENCOUNTER — Other Ambulatory Visit: Payer: Self-pay | Admitting: Family Medicine

## 2019-03-28 DIAGNOSIS — M19071 Primary osteoarthritis, right ankle and foot: Secondary | ICD-10-CM

## 2019-04-20 ENCOUNTER — Telehealth: Payer: Self-pay | Admitting: Family Medicine

## 2019-04-20 NOTE — Telephone Encounter (Signed)
Called about message sent in:  Appointment Request From: Sebastian Ache    With Provider: Libby Maw, MD [[facility]]    Preferred Date Range: 04/19/2019 - 04/19/2019    Preferred Times: Any Time    Reason for visit: Request an Appointment    Comments:  Bone spurs, hip pain, and possibly schedule surgery   Left message

## 2019-04-23 NOTE — Telephone Encounter (Signed)
Left message again.

## 2019-04-26 ENCOUNTER — Other Ambulatory Visit: Payer: Self-pay | Admitting: Family Medicine

## 2019-04-26 DIAGNOSIS — M19071 Primary osteoarthritis, right ankle and foot: Secondary | ICD-10-CM

## 2019-05-28 ENCOUNTER — Other Ambulatory Visit: Payer: Self-pay | Admitting: Family Medicine

## 2019-05-28 DIAGNOSIS — M19071 Primary osteoarthritis, right ankle and foot: Secondary | ICD-10-CM

## 2019-06-13 ENCOUNTER — Ambulatory Visit: Payer: BLUE CROSS/BLUE SHIELD | Admitting: Family Medicine

## 2019-06-13 ENCOUNTER — Other Ambulatory Visit: Payer: Self-pay

## 2019-06-14 ENCOUNTER — Ambulatory Visit (INDEPENDENT_AMBULATORY_CARE_PROVIDER_SITE_OTHER): Payer: BC Managed Care – PPO | Admitting: Family Medicine

## 2019-06-14 ENCOUNTER — Encounter: Payer: Self-pay | Admitting: Family Medicine

## 2019-06-14 VITALS — BP 128/80 | Ht 74.0 in | Wt 233.0 lb

## 2019-06-14 DIAGNOSIS — G8929 Other chronic pain: Secondary | ICD-10-CM

## 2019-06-14 DIAGNOSIS — M25571 Pain in right ankle and joints of right foot: Secondary | ICD-10-CM | POA: Diagnosis not present

## 2019-06-14 DIAGNOSIS — Z1211 Encounter for screening for malignant neoplasm of colon: Secondary | ICD-10-CM | POA: Diagnosis not present

## 2019-06-14 NOTE — Patient Instructions (Signed)
Open Ankle Fusion Ankle fusion is a procedure that joins (fuses) together the bones that make up the ankle joint. In this procedure, the bones are fused together using devices such as screws, plates, and pins. Bone paste may also be used. This procedure is usually done to treat ankle arthritis that has not gotten better with other treatments. Ankle fusion reduces pain and movement, which improves ankle function. Tell a health care provider about:  Any allergies you have.  All medicines you are taking, including vitamins, herbs, eye drops, creams, and over-the-counter medicines.  Any problems you or family members have had with anesthetic medicines.  Any blood disorders you have.  Any surgeries you have had.  Any medical conditions you have.  Whether you are pregnant or may be pregnant. What are the risks? Generally, this is a safe procedure. However, problems may occur, including:  Infection.  Bleeding.  Allergic reactions to medicines.  Damage to nerves or blood vessels.  Failure to heal (nonunion).  A blood clot that forms in the leg and travels to the lungs (pulmonary embolism). What happens before the procedure? Staying hydrated Follow instructions from your health care provider about hydration, which may include:  Up to 2 hours before the procedure - you may continue to drink clear liquids, such as water, clear fruit juice, black coffee, and plain tea.  Eating and drinking restrictions Follow instructions from your health care provider about eating and drinking, which may include:  8 hours before the procedure - stop eating heavy meals or foods, such as meat, fried foods, or fatty foods.  6 hours before the procedure - stop eating light meals or foods, such as toast or cereal.  6 hours before the procedure - stop drinking milk or drinks that contain milk.  2 hours before the procedure - stop drinking clear liquids. Medicines Ask your health care provider about:   Changing or stopping your regular medicines. This is especially important if you are taking diabetes medicines or blood thinners.  Taking medicines such as aspirin and ibuprofen. These medicines can thin your blood. Do not take these medicines unless your health care provider tells you to take them.  Taking over-the-counter medicines, vitamins, herbs, and supplements. General instructions  You may have a physical exam. X-rays may be taken.  Ask your health care provider: ? How your surgical site will be marked. ? What steps will be taken to help prevent infection. These may include:  Washing skin with a germ-killing soap.  Receiving antibiotic medicine.  Do not use any products that contain nicotine or tobacco for at least 4 weeks before the procedure. These products include cigarettes, e-cigarettes, and chewing tobacco. If you need help quitting, ask your health care provider.  Plan to have someone take you home from the hospital. What happens during the procedure?      An IV will be inserted into one of your veins.  You will be given one or more of the following: ? A medicine to help you relax (sedative). ? A medicine to make you fall asleep (general anesthetic). ? A medicine that is injected into your spine to numb the area below and slightly above the injection site (spinal anesthetic). ? A medicine that is injected into an area of your body to numb everything below the injection site (regional anesthetic).  A compression bandage (tourniquet) will be applied around your thigh to reduce bleeding.  One or two incisions will be made in the front or side of  your ankle.  Your surgeon will open the incision to view the bones of your ankle joint.  All the tissue (cartilage) between your ankle joints will be removed.  Your ankle will be placed in the best position for fusion. X-rays may be taken to check the position.  Bone paste, screws, pins, or metal plates may be used to  fuse the bones together.  The incisions will be closed with staples or stitches (sutures).  A bandage (dressing) will be placed over the incision.  A cast, splint, or boot will be placed to hold your ankle in position for healing. The procedure may vary among health care providers and hospitals. What happens after the procedure?  Your blood pressure, heart rate, breathing rate, and blood oxygen level will be monitored until you leave the hospital.  Your ankle will be raised (elevated).  You will be given medicine for pain.  You will be given crutches or a walker to help you move around. Summary  Ankle fusion is a procedure that joins (fuses) together the bones that make up the ankle joint. It is usually done to treat ankle arthritis that has not gotten better with other treatments.  Follow instructions from your health care provider about taking medicines and about eating and drinking before the procedure.  During the procedure, bone paste, screws, pins, or metal plates may be used to fuse the bones together. A cast, splint, or boot will be placed to hold your ankle in position for healing.  You will be given crutches or a walker to help you move around after the procedure. This information is not intended to replace advice given to you by your health care provider. Make sure you discuss any questions you have with your health care provider. Document Released: 02/17/2010 Document Revised: 08/30/2018 Document Reviewed: 08/30/2018 Elsevier Patient Education  2020 [employer].  Ankle Replacement Ankle replacement is a procedure that replaces damaged bones in the ankle joint with artificial parts (a prosthesis). The prosthesis is made of plastic, metal, or ceramic, and it moves and supports weight like a real ankle joint. You may have this procedure if you have a condition that causes inflammation in your ankle, such as a bone fracture or arthritis. This procedure helps to relieve pain  and improve ankle function. Tell a health care provider about:  Any allergies you have.  All medicines you are taking, including vitamins, herbs, eye drops, creams, and over-the-counter medicines.  Any problems you or family members have had with anesthetic medicines.  Any blood disorders you have.  Any surgeries you have had.  Any medical conditions you have.  Whether you are pregnant or may be pregnant. What are the risks? Generally, this is a safe procedure. However, problems may occur, including:  Infection.  Bleeding.  Allergic reactions to medicines.  Damage to other structures or organs.  A blood clot.  A bone fracture on either side of the prosthesis.  Failure of the prosthesis to heal into the bone. What happens before the procedure? Staying hydrated Follow instructions from your health care provider about hydration, which may include:  Up to 2 hours before the procedure - you may continue to drink clear liquids, such as water, clear fruit juice, black coffee, and plain tea. Eating and drinking restrictions Follow instructions from your health care provider about eating and drinking, which may include:  8 hours before the procedure - stop eating heavy meals or foods such as meat, fried foods, or fatty foods.  6 hours before the procedure - stop eating light meals or foods, such as toast or cereal.  6 hours before the procedure - stop drinking milk or drinks that contain milk.  2 hours before the procedure - stop drinking clear liquids. General instructions   Stop smoking or smoke less for as long as possible before your procedure. If you need help quitting, ask your health care provider.  Avoid shaving your legs or removing hair from the area where your surgery will be performed.  Follow instructions from your health care provider about eating or drinking restrictions.  Ask your health care provider about changing or stopping your regular medicines.  This is especially important if you are taking diabetes medicines, steroids, or blood thinners.  Plan to have someone take you home from the hospital or clinic.  Plan to have someone check on you or stay with you for several days after you leave the hospital or clinic. What happens during the procedure?  To reduce your risk of infection: ? Your health care team will wash or sanitize their hands. ? Your skin will be washed with soap.  An IV tube will be inserted into one of your veins.  You will be given one or more of the following: ? A medicine to help you relax (sedative). ? A medicine to make you fall asleep (general anesthetic). ? A medicine that is injected into an area of your body to numb everything below the injection site (regional anesthetic).  An incision will be made in the front or side of your ankle.  Damaged cartilage and ankle bones will be cut and removed.  The remaining bones will be reshaped to fit the ankle prosthesis.  The prosthesis will be inserted into your ankle area and adjusted to fit the length of the bones that were removed.  The prosthesis will be cemented and screwed into place.  Your incision will be closed with stitches or staples.  Your ankle will be placed in a splint to keep the joint from moving. The procedure may vary among health care providers and hospitals. What happens after the procedure?   You will not be able to put weight on your affected leg. You will be given a walker or crutches to help you move around.  Your ankle will be raised (elevated) to manage swelling and help you heal.  You will be given pain medicine as needed.  You will continue to have a splint on your ankle. After swelling in your ankle is reduced, your splint will be replaced with a cast. The cast may be on your ankle, or it may cover your ankle and your entire foot (boot).  Do not drive for 24 hours if you received a sedative. Ask your health care provider when  it is safe for you to drive.  You may be shown how to do exercises to improve the strength and movement of your ankle.  Your blood pressure, heart rate, breathing rate, and blood oxygen level will be monitored until the medicines you were given have worn off. This information is not intended to replace advice given to you by your health care provider. Make sure you discuss any questions you have with your health care provider. Document Released: 06/14/2014 Document Revised: 10/10/2018 Document Reviewed: 10/17/2015 Elsevier Patient Education  2020 [employer]

## 2019-06-14 NOTE — Progress Notes (Signed)
Established Patient Office Visit  Subjective:  Patient ID: Chad Hernandez, male    DOB: 1970/11/09  Age: 48 y.o. MRN: [id number]  CC:  Chief Complaint  Patient presents with  . Ankle Pain  . Foot Pain  . Hip Pain    HPI Chad Hernandez presents for follow-up of his right ankle pain.  Pain persists and is chronic unfortunately continues to be relieved with Duexis and Pennsaid.  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  He is ready to consider definitive care for this problem.  He has been having some issues with his left hip as well.  History reviewed. No pertinent past medical history.  Past Surgical History:  Procedure Laterality Date  . EYE SURGERY    . HAND SURGERY      Family History  Problem Relation Age of Onset  . Hyperlipidemia Mother   . Hypertension Mother   . Breast cancer Mother   . Hypertension Brother   . Diabetes Neg Hx   . Heart attack Neg Hx   . Sudden death Neg Hx   . Cancer Neg Hx   . COPD Neg Hx   . Early death Neg Hx   . Hearing loss Neg Hx   . Heart disease Neg Hx   . Kidney disease Neg Hx   . Stroke Neg Hx     Social History   Socioeconomic History  . Marital status: Married    Spouse name: Not on file  . Number of children: Not on file  . Years of education: Not on file  . Highest education level: Not on file  Occupational History  . Not on file  Social Needs  . Financial resource strain: Not on file  . Food insecurity    Worry: Not on file    Inability: Not on file  . Transportation needs    Medical: Not on file    Non-medical: Not on file  Tobacco Use  . Smoking status: Never Smoker  . Smokeless tobacco: Never Used  Substance and Sexual Activity  . Alcohol use: Yes    Alcohol/week: 3.0 standard drinks    Types: 3 Cans of beer per week    Comment: rarely drinks.   . Drug use: No  . Sexual activity: Yes  Lifestyle  . Physical activity    Days per week: Not on file    Minutes  per session: Not on file  . Stress: Not on file  Relationships  . Social Musician on phone: Not on file    Gets together: Not on file    Attends religious service: Not on file    Active member of club or organization: Not on file    Attends meetings of clubs or organizations: Not on file    Relationship status: Not on file  . Intimate partner violence    Fear of current or ex partner: Not on file    Emotionally abused: Not on file    Physically abused: Not on file    Forced sexual activity: Not on file  Other Topics Concern  . Not on file  Social History Narrative  . Not on file    Outpatient Medications Prior to Visit  Medication Sig Dispense Refill  . DUEXIS 800-26.6 MG TABS TAKE ONE TABLET BY MOUTH THREE TIMES A DAY 90 tablet 1  . pantoprazole (PROTONIX) 40 MG tablet Take 1 tablet (40  mg total) by mouth daily. 30 tablet 3  . PENNSAID 2 % SOLN APPLY 1 PUMPS (20MG ) TOPICALLY TO AFFECTED AREA(S) TWICE A DAY 112 g 0   No facility-administered medications prior to visit.     No Known Allergies  ROS Review of Systems  Constitutional: Negative.   Respiratory: Negative.   Cardiovascular: Negative.   Gastrointestinal: Negative.   Genitourinary: Negative.   Musculoskeletal: Positive for arthralgias and gait problem.  Neurological: Negative for weakness and numbness.  Hematological: Does not bruise/bleed easily.  Psychiatric/Behavioral: Negative.       Objective:    Physical Exam  Constitutional: He is oriented to person, place, and time. He appears well-developed and well-nourished. No distress.  HENT:  Head: Normocephalic and atraumatic.  Right Ear: External ear normal.  Left Ear: External ear normal.  Eyes: Right eye exhibits no discharge. Left eye exhibits no discharge. No scleral icterus.  Cardiovascular:  Pulses:      Dorsalis pedis pulses are 2+ on the right side.       Posterior tibial pulses are 1+ on the right side.  Pulmonary/Chest: Effort  normal.  Musculoskeletal:     Left hip: He exhibits decreased range of motion and decreased strength.     Right ankle: He exhibits decreased range of motion, swelling and deformity. He exhibits normal pulse. Tenderness. Medial malleolus tenderness found. No AITFL, no CF ligament and no posterior TFL tenderness found.  Neurological: He is alert and oriented to person, place, and time.  Skin: Skin is warm and dry. He is not diaphoretic.  Psychiatric: He has a normal mood and affect. His behavior is normal.    BP 128/80   Ht 6\' 2"  (1.88 m)   Wt 233 lb (105.7 kg)   BMI 29.92 kg/m  Wt Readings from Last 3 Encounters:  06/14/19 233 lb (105.7 kg)  10/30/18 236 lb (107 kg)  09/26/18 238 lb (108 kg)   BP Readings from Last 3 Encounters:  06/14/19 128/80  10/30/18 (!) 143/88  09/26/18 130/90   Guideline developer:  UpToDate (see UpToDate for funding source) Date Released: June 2014  Health Maintenance Due  Topic Date Due  . HIV Screening  04/06/1984  . TETANUS/TDAP  03/07/2017  . COLONOSCOPY  04/07/2019  . INFLUENZA VACCINE  04/14/2019    There are no preventive care reminders to display for this patient.  Lab Results  Component Value Date   TSH 1.55 09/26/2018   Lab Results  Component Value Date   WBC 5.7 09/26/2018   HGB 15.3 09/26/2018   HCT 44.9 09/26/2018   MCV 92.3 09/26/2018   PLT 201.0 09/26/2018   Lab Results  Component Value Date   NA 139 09/26/2018   K 4.1 09/26/2018   CO2 25 09/26/2018   GLUCOSE 93 09/26/2018   BUN 13 09/26/2018   CREATININE 1.06 09/26/2018   BILITOT 0.5 09/26/2018   ALKPHOS 96 09/26/2018   AST 21 09/26/2018   ALT 31 09/26/2018   PROT 7.6 09/26/2018   ALBUMIN 4.5 09/26/2018   CALCIUM 9.2 09/26/2018   GFR 95.31 09/26/2018   Lab Results  Component Value Date   CHOL 138 09/26/2018   Lab Results  Component Value Date   HDL 37.00 (L) 09/26/2018   Lab Results  Component Value Date   LDLCALC 91 09/26/2018   Lab Results   Component Value Date   TRIG 54.0 09/26/2018   Lab Results  Component Value Date   CHOLHDL 4 09/26/2018   No  results found for: HGBA1C    Assessment & Plan:   Problem List Items Addressed This Visit    None    Visit Diagnoses    Screen for colon cancer    -  Primary   Relevant Orders   Ambulatory referral to Gastroenterology   Chronic pain of right ankle       Relevant Orders   Ambulatory referral to Orthopedic Surgery      No orders of the defined types were placed in this encounter.      Follow-up: Return in about 3 months (around 09/14/2019), or physical.      Patient referred to orthopedist for consideration of fusion versus replacement of his right ankle.  Also asked for evaluation of left hip pain.  Suggested that he consider having a colonoscopy performed while he is out with his ankle surgery.  Also that suggest that he return to see me for physical during that time.

## 2019-07-03 ENCOUNTER — Other Ambulatory Visit: Payer: Self-pay | Admitting: Family Medicine

## 2019-07-03 DIAGNOSIS — M19071 Primary osteoarthritis, right ankle and foot: Secondary | ICD-10-CM

## 2019-07-18 ENCOUNTER — Encounter: Payer: Self-pay | Admitting: Family Medicine

## 2019-07-30 ENCOUNTER — Other Ambulatory Visit: Payer: Self-pay | Admitting: Family Medicine

## 2019-07-30 DIAGNOSIS — M19071 Primary osteoarthritis, right ankle and foot: Secondary | ICD-10-CM

## 2019-08-22 ENCOUNTER — Other Ambulatory Visit: Payer: Self-pay | Admitting: Family Medicine

## 2019-08-22 DIAGNOSIS — M19071 Primary osteoarthritis, right ankle and foot: Secondary | ICD-10-CM

## 2019-09-03 ENCOUNTER — Other Ambulatory Visit: Payer: Self-pay | Admitting: Family Medicine

## 2019-09-03 DIAGNOSIS — M19071 Primary osteoarthritis, right ankle and foot: Secondary | ICD-10-CM

## 2019-09-13 ENCOUNTER — Other Ambulatory Visit: Payer: Self-pay | Admitting: Family Medicine

## 2019-09-13 DIAGNOSIS — M19071 Primary osteoarthritis, right ankle and foot: Secondary | ICD-10-CM

## 2019-10-15 ENCOUNTER — Other Ambulatory Visit: Payer: Self-pay | Admitting: Family Medicine

## 2019-10-15 DIAGNOSIS — M19071 Primary osteoarthritis, right ankle and foot: Secondary | ICD-10-CM

## 2019-11-12 ENCOUNTER — Other Ambulatory Visit: Payer: Self-pay | Admitting: Family Medicine

## 2019-11-12 DIAGNOSIS — M19071 Primary osteoarthritis, right ankle and foot: Secondary | ICD-10-CM

## 2020-01-11 ENCOUNTER — Other Ambulatory Visit: Payer: Self-pay | Admitting: Family Medicine

## 2020-01-11 DIAGNOSIS — M19071 Primary osteoarthritis, right ankle and foot: Secondary | ICD-10-CM

## 2020-01-26 ENCOUNTER — Other Ambulatory Visit: Payer: Self-pay | Admitting: Family Medicine

## 2020-01-26 DIAGNOSIS — M19071 Primary osteoarthritis, right ankle and foot: Secondary | ICD-10-CM

## 2020-01-31 ENCOUNTER — Other Ambulatory Visit (HOSPITAL_COMMUNITY): Payer: Self-pay | Admitting: Orthopedic Surgery

## 2020-03-06 ENCOUNTER — Telehealth: Payer: Self-pay | Admitting: Family Medicine

## 2020-03-06 DIAGNOSIS — M19071 Primary osteoarthritis, right ankle and foot: Secondary | ICD-10-CM

## 2020-03-06 MED ORDER — DUEXIS 800-26.6 MG PO TABS: 1.0000 | ORAL_TABLET | Freq: Three times a day (TID) | ORAL | 1 refills | Status: DC

## 2020-03-06 NOTE — Telephone Encounter (Signed)
Pharmacy calling on behalf of patient of request a refill of Duexis

## 2020-03-06 NOTE — Telephone Encounter (Signed)
Refilled duexis.   Myra Rude, MD [facility] 03/06/2020, 5:06 PM

## 2020-04-01 ENCOUNTER — Other Ambulatory Visit: Payer: Self-pay | Admitting: Family Medicine

## 2020-04-01 DIAGNOSIS — M19071 Primary osteoarthritis, right ankle and foot: Secondary | ICD-10-CM

## 2020-04-02 ENCOUNTER — Encounter (HOSPITAL_BASED_OUTPATIENT_CLINIC_OR_DEPARTMENT_OTHER): Payer: Self-pay | Admitting: Orthopedic Surgery

## 2020-04-02 ENCOUNTER — Other Ambulatory Visit: Payer: Self-pay

## 2020-04-07 ENCOUNTER — Other Ambulatory Visit (HOSPITAL_COMMUNITY)
Admission: RE | Admit: 2020-04-07 | Discharge: 2020-04-07 | Disposition: A | Discharge status: Home / Self Care | Payer: BC Managed Care – PPO | Source: Ambulatory Visit | Attending: Orthopedic Surgery | Admitting: Orthopedic Surgery

## 2020-04-07 ENCOUNTER — Telehealth: Payer: Self-pay | Admitting: Family Medicine

## 2020-04-07 DIAGNOSIS — Z20822 Contact with and (suspected) exposure to covid-19: Secondary | ICD-10-CM | POA: Insufficient documentation

## 2020-04-07 DIAGNOSIS — Z01812 Encounter for preprocedural laboratory examination: Secondary | ICD-10-CM | POA: Diagnosis present

## 2020-04-07 DIAGNOSIS — M19071 Primary osteoarthritis, right ankle and foot: Secondary | ICD-10-CM

## 2020-04-07 LAB — SARS CORONAVIRUS 2 (TAT 6-24 HRS): SARS Coronavirus 2: NEGATIVE

## 2020-04-07 NOTE — Telephone Encounter (Signed)
[facility] called to inquire on status of Rx refills :  Ibuprofen-Famotidine (DUEXIS) 800-26.6 MG TABS [[id number]]   Order Details Dose: 1 tablet Route: Oral Frequency: 3 times daily  Dispense Quantity: 90 tablet Refills: 1       Sig: Take 1 tablet by mouth 3 (three) times daily.   Pt uses :   [id number] - [place], [place] - [address]  [address] [place], [place] [address]  Phone:  [phone] Fax:  [phone]   --glh

## 2020-04-08 MED ORDER — DUEXIS 800-26.6 MG PO TABS: 1.0000 | ORAL_TABLET | Freq: Three times a day (TID) | ORAL | 1 refills | Status: DC

## 2020-04-08 NOTE — Telephone Encounter (Signed)
Refilled duexis.   Myra Rude, MD [facility] 04/08/2020, 8:03 AM

## 2020-04-09 ENCOUNTER — Encounter (HOSPITAL_BASED_OUTPATIENT_CLINIC_OR_DEPARTMENT_OTHER)
Admission: RE | Admit: 2020-04-09 | Discharge: 2020-04-09 | Disposition: A | Discharge status: Home / Self Care | Payer: BC Managed Care – PPO | Source: Ambulatory Visit | Attending: Orthopedic Surgery | Admitting: Orthopedic Surgery

## 2020-04-09 DIAGNOSIS — M6701 Short Achilles tendon (acquired), right ankle: Secondary | ICD-10-CM | POA: Diagnosis not present

## 2020-04-09 DIAGNOSIS — Z791 Long term (current) use of non-steroidal anti-inflammatories (NSAID): Secondary | ICD-10-CM | POA: Diagnosis not present

## 2020-04-09 DIAGNOSIS — M25771 Osteophyte, right ankle: Secondary | ICD-10-CM | POA: Diagnosis not present

## 2020-04-09 DIAGNOSIS — M19171 Post-traumatic osteoarthritis, right ankle and foot: Secondary | ICD-10-CM | POA: Diagnosis present

## 2020-04-09 DIAGNOSIS — F17290 Nicotine dependence, other tobacco product, uncomplicated: Secondary | ICD-10-CM | POA: Diagnosis not present

## 2020-04-09 DIAGNOSIS — G4730 Sleep apnea, unspecified: Secondary | ICD-10-CM | POA: Diagnosis not present

## 2020-04-09 LAB — SURGICAL PCR SCREEN
MRSA, PCR: NEGATIVE
Staphylococcus aureus: NEGATIVE

## 2020-04-10 ENCOUNTER — Ambulatory Visit (HOSPITAL_BASED_OUTPATIENT_CLINIC_OR_DEPARTMENT_OTHER)
Admission: RE | Admit: 2020-04-10 | Discharge: 2020-04-11 | Disposition: A | Discharge status: Home / Self Care | Payer: BC Managed Care – PPO | Attending: Orthopedic Surgery | Admitting: Orthopedic Surgery

## 2020-04-10 ENCOUNTER — Ambulatory Visit (HOSPITAL_BASED_OUTPATIENT_CLINIC_OR_DEPARTMENT_OTHER): Payer: BC Managed Care – PPO | Admitting: Certified Registered"

## 2020-04-10 ENCOUNTER — Encounter (HOSPITAL_BASED_OUTPATIENT_CLINIC_OR_DEPARTMENT_OTHER)
Admission: RE | Disposition: A | Discharge status: Home / Self Care | Payer: Self-pay | Source: Home / Self Care | Attending: Orthopedic Surgery

## 2020-04-10 ENCOUNTER — Ambulatory Visit (HOSPITAL_COMMUNITY): Payer: BC Managed Care – PPO

## 2020-04-10 ENCOUNTER — Encounter (HOSPITAL_BASED_OUTPATIENT_CLINIC_OR_DEPARTMENT_OTHER): Payer: Self-pay | Admitting: Orthopedic Surgery

## 2020-04-10 ENCOUNTER — Other Ambulatory Visit: Payer: Self-pay

## 2020-04-10 DIAGNOSIS — M25771 Osteophyte, right ankle: Secondary | ICD-10-CM | POA: Insufficient documentation

## 2020-04-10 DIAGNOSIS — Z96661 Presence of right artificial ankle joint: Secondary | ICD-10-CM

## 2020-04-10 DIAGNOSIS — M6701 Short Achilles tendon (acquired), right ankle: Secondary | ICD-10-CM | POA: Insufficient documentation

## 2020-04-10 DIAGNOSIS — F17290 Nicotine dependence, other tobacco product, uncomplicated: Secondary | ICD-10-CM | POA: Insufficient documentation

## 2020-04-10 DIAGNOSIS — M19171 Post-traumatic osteoarthritis, right ankle and foot: Secondary | ICD-10-CM | POA: Diagnosis not present

## 2020-04-10 DIAGNOSIS — Z791 Long term (current) use of non-steroidal anti-inflammatories (NSAID): Secondary | ICD-10-CM | POA: Insufficient documentation

## 2020-04-10 DIAGNOSIS — G4730 Sleep apnea, unspecified: Secondary | ICD-10-CM | POA: Insufficient documentation

## 2020-04-10 HISTORY — PX: TOTAL ANKLE ARTHROPLASTY: SHX811

## 2020-04-10 SURGERY — ARTHROPLASTY, ANKLE, TOTAL
Anesthesia: Regional | Site: Ankle | Laterality: Right

## 2020-04-10 MED ORDER — SENNA 8.6 MG PO TABS: 1.0000 | ORAL_TABLET | Freq: Two times a day (BID) | ORAL | Status: DC

## 2020-04-10 MED ORDER — ONDANSETRON HCL 4 MG/2ML IJ SOLN: 4.0000 mg | Freq: Four times a day (QID) | INTRAMUSCULAR | Status: DC | PRN

## 2020-04-10 MED ORDER — ONDANSETRON HCL 4 MG PO TABS: 4.0000 mg | ORAL_TABLET | Freq: Four times a day (QID) | ORAL | Status: DC | PRN

## 2020-04-10 MED ORDER — MIDAZOLAM HCL 2 MG/2ML IJ SOLN
INTRAMUSCULAR | Status: AC
  Filled 2020-04-10: qty 2

## 2020-04-10 MED ORDER — SENNA 8.6 MG PO TABS
2.0000 | ORAL_TABLET | Freq: Two times a day (BID) | ORAL | 0 refills | Status: DC
Start: 2020-04-10 — End: 2020-06-16

## 2020-04-10 MED ORDER — OXYCODONE HCL 5 MG/5ML PO SOLN: 5.0000 mg | Freq: Once | ORAL | Status: DC | PRN

## 2020-04-10 MED ORDER — SODIUM CHLORIDE 0.9 % IV SOLN: INTRAVENOUS | Status: DC

## 2020-04-10 MED ORDER — METHOCARBAMOL 500 MG PO TABS
500.0000 mg | ORAL_TABLET | Freq: Four times a day (QID) | ORAL | Status: DC | PRN
  Administered 2020-04-10 – 2020-04-11 (×2): 500 mg via ORAL
  Filled 2020-04-10 (×2): qty 1

## 2020-04-10 MED ORDER — OXYCODONE HCL 5 MG PO TABS: 5.0000 mg | ORAL_TABLET | ORAL | 0 refills | Status: AC | PRN

## 2020-04-10 MED ORDER — PROPOFOL 10 MG/ML IV BOLUS
INTRAVENOUS | Status: DC | PRN
  Administered 2020-04-10: 270 mg via INTRAVENOUS

## 2020-04-10 MED ORDER — ACETAMINOPHEN 500 MG PO TABS
1000.0000 mg | ORAL_TABLET | Freq: Once | ORAL | Status: AC | PRN
  Administered 2020-04-10: 1000 mg via ORAL
  Filled 2020-04-10: qty 2

## 2020-04-10 MED ORDER — VANCOMYCIN HCL 500 MG IV SOLR
INTRAVENOUS | Status: AC
  Filled 2020-04-10: qty 2000

## 2020-04-10 MED ORDER — 0.9 % SODIUM CHLORIDE (POUR BTL) OPTIME
TOPICAL | Status: DC | PRN
  Administered 2020-04-10: 1000 mL

## 2020-04-10 MED ORDER — ACETAMINOPHEN 160 MG/5ML PO SOLN: 1000.0000 mg | Freq: Once | ORAL | Status: AC | PRN

## 2020-04-10 MED ORDER — FENTANYL CITRATE (PF) 100 MCG/2ML IJ SOLN: 25.0000 ug | INTRAMUSCULAR | Status: DC | PRN

## 2020-04-10 MED ORDER — ONDANSETRON HCL 4 MG/2ML IJ SOLN
INTRAMUSCULAR | Status: DC | PRN
  Administered 2020-04-10: 4 mg via INTRAVENOUS

## 2020-04-10 MED ORDER — ACETAMINOPHEN 325 MG PO TABS
325.0000 mg | ORAL_TABLET | Freq: Four times a day (QID) | ORAL | Status: DC | PRN
  Administered 2020-04-11: 650 mg via ORAL
  Filled 2020-04-10: qty 2

## 2020-04-10 MED ORDER — OXYCODONE HCL 5 MG PO TABS: 5.0000 mg | ORAL_TABLET | Freq: Once | ORAL | Status: DC | PRN

## 2020-04-10 MED ORDER — ACETAMINOPHEN 10 MG/ML IV SOLN: 1000.0000 mg | Freq: Once | INTRAVENOUS | Status: DC | PRN

## 2020-04-10 MED ORDER — VANCOMYCIN HCL 500 MG IV SOLR
INTRAVENOUS | Status: DC | PRN
  Administered 2020-04-10: 500 mg via TOPICAL

## 2020-04-10 MED ORDER — ROPIVACAINE HCL 7.5 MG/ML IJ SOLN
INTRAMUSCULAR | Status: DC | PRN
  Administered 2020-04-10: 13 mL via PERINEURAL

## 2020-04-10 MED ORDER — CEFAZOLIN SODIUM-DEXTROSE 2-4 GM/100ML-% IV SOLN
2.0000 g | INTRAVENOUS | Status: AC
  Administered 2020-04-10: 2 g via INTRAVENOUS

## 2020-04-10 MED ORDER — PHENYLEPHRINE HCL (PRESSORS) 10 MG/ML IV SOLN
INTRAVENOUS | Status: DC | PRN
  Administered 2020-04-10 (×2): 80 ug via INTRAVENOUS
  Administered 2020-04-10: 120 ug via INTRAVENOUS
  Administered 2020-04-10 (×2): 80 ug via INTRAVENOUS

## 2020-04-10 MED ORDER — FENTANYL CITRATE (PF) 100 MCG/2ML IJ SOLN
100.0000 ug | Freq: Once | INTRAMUSCULAR | Status: AC
  Administered 2020-04-10: 100 ug via INTRAVENOUS

## 2020-04-10 MED ORDER — ASPIRIN EC 81 MG PO TBEC
81.0000 mg | DELAYED_RELEASE_TABLET | Freq: Two times a day (BID) | ORAL | 0 refills | Status: DC
Start: 2020-04-10 — End: 2020-06-16

## 2020-04-10 MED ORDER — LIDOCAINE 2% (20 MG/ML) 5 ML SYRINGE
INTRAMUSCULAR | Status: DC | PRN
  Administered 2020-04-10: 60 mg via INTRAVENOUS

## 2020-04-10 MED ORDER — METHOCARBAMOL 1000 MG/10ML IJ SOLN
500.0000 mg | Freq: Four times a day (QID) | INTRAVENOUS | Status: DC | PRN
  Filled 2020-04-10: qty 5

## 2020-04-10 MED ORDER — FENTANYL CITRATE (PF) 100 MCG/2ML IJ SOLN
INTRAMUSCULAR | Status: DC | PRN
  Administered 2020-04-10: 25 ug via INTRAVENOUS

## 2020-04-10 MED ORDER — CEFAZOLIN SODIUM-DEXTROSE 2-4 GM/100ML-% IV SOLN
INTRAVENOUS | Status: AC
  Filled 2020-04-10: qty 100

## 2020-04-10 MED ORDER — MIDAZOLAM HCL 2 MG/2ML IJ SOLN
2.0000 mg | Freq: Once | INTRAMUSCULAR | Status: AC
  Administered 2020-04-10: 2 mg via INTRAVENOUS

## 2020-04-10 MED ORDER — DOCUSATE SODIUM 100 MG PO CAPS
100.0000 mg | ORAL_CAPSULE | Freq: Two times a day (BID) | ORAL | 0 refills | Status: DC
Start: 2020-04-10 — End: 2020-06-16

## 2020-04-10 MED ORDER — METOCLOPRAMIDE HCL 5 MG PO TABS: 5.0000 mg | ORAL_TABLET | Freq: Three times a day (TID) | ORAL | Status: DC | PRN

## 2020-04-10 MED ORDER — FENTANYL CITRATE (PF) 100 MCG/2ML IJ SOLN
INTRAMUSCULAR | Status: AC
  Filled 2020-04-10: qty 2

## 2020-04-10 MED ORDER — HYDROMORPHONE HCL 1 MG/ML IJ SOLN: 0.5000 mg | INTRAMUSCULAR | Status: DC | PRN

## 2020-04-10 MED ORDER — OXYCODONE HCL 5 MG PO TABS: 10.0000 mg | ORAL_TABLET | ORAL | Status: DC | PRN

## 2020-04-10 MED ORDER — OXYCODONE HCL 5 MG PO TABS
5.0000 mg | ORAL_TABLET | ORAL | Status: DC | PRN
  Administered 2020-04-10: 5 mg via ORAL
  Administered 2020-04-11 (×2): 10 mg via ORAL
  Filled 2020-04-10: qty 1
  Filled 2020-04-10 (×2): qty 2

## 2020-04-10 MED ORDER — METOCLOPRAMIDE HCL 5 MG/ML IJ SOLN: 5.0000 mg | Freq: Three times a day (TID) | INTRAMUSCULAR | Status: DC | PRN

## 2020-04-10 MED ORDER — LACTATED RINGERS IV SOLN: INTRAVENOUS | Status: DC

## 2020-04-10 MED ORDER — PROPOFOL 500 MG/50ML IV EMUL
INTRAVENOUS | Status: DC | PRN
Start: 2020-04-10 — End: 2020-04-10
  Administered 2020-04-10: 50 ug/kg/min via INTRAVENOUS

## 2020-04-10 MED ORDER — DOCUSATE SODIUM 100 MG PO CAPS: 100.0000 mg | ORAL_CAPSULE | Freq: Two times a day (BID) | ORAL | Status: DC

## 2020-04-10 MED ORDER — BUPIVACAINE-EPINEPHRINE (PF) 0.5% -1:200000 IJ SOLN
INTRAMUSCULAR | Status: DC | PRN
Start: 2020-04-10 — End: 2020-04-10
  Administered 2020-04-10: 30 mL via PERINEURAL

## 2020-04-10 MED ORDER — DEXAMETHASONE SODIUM PHOSPHATE 10 MG/ML IJ SOLN
INTRAMUSCULAR | Status: DC | PRN
  Administered 2020-04-10: 5 mg via INTRAVENOUS

## 2020-04-10 SURGICAL SUPPLY — 88 items
APL PRP STRL LF DISP 70% ISPRP (MISCELLANEOUS) ×2
BANDAGE ESMARK 6X9 LF (GAUZE/BANDAGES/DRESSINGS) IMPLANT
BLADE KM SAW (BLADE) ×2 IMPLANT
BLADE OSC (BLADE) IMPLANT
BLADE RECIP (BLADE) IMPLANT
BLADE RECIPRO TAPERED (BLADE) ×3 IMPLANT
BLADE SAW 8X12 (BLADE) ×1
BLADE SAW OSC ANKLE 8X63X1.19 (BLADE) ×1 IMPLANT
BLADE SURG 15 STRL LF DISP TIS (BLADE) ×3 IMPLANT
BLADE SURG 15 STRL SS (BLADE) ×9
BNDG CMPR 9X6 STRL LF SNTH (GAUZE/BANDAGES/DRESSINGS)
BNDG COHESIVE 4X5 TAN STRL (GAUZE/BANDAGES/DRESSINGS) ×3 IMPLANT
BNDG ELASTIC 4X5.8 VLCR STR LF (GAUZE/BANDAGES/DRESSINGS) ×3 IMPLANT
BNDG ELASTIC 6X5.8 VLCR STR LF (GAUZE/BANDAGES/DRESSINGS) IMPLANT
BNDG ESMARK 6X9 LF (GAUZE/BANDAGES/DRESSINGS)
CHLORAPREP W/TINT 26 (MISCELLANEOUS) ×5 IMPLANT
CLIP LOCK TOTAL ANKLE SZ4 (Orthopedic Implant) ×2 IMPLANT
COVER BACK TABLE 60X90IN (DRAPES) ×6 IMPLANT
COVER MAYO STAND STRL (DRAPES) ×3 IMPLANT
COVER WAND RF STERILE (DRAPES) IMPLANT
CUFF TOURN SGL QUICK 34 (TOURNIQUET CUFF) ×3
CUFF TRNQT CYL 34X4.125X (TOURNIQUET CUFF) IMPLANT
DECANTER SPIKE VIAL GLASS SM (MISCELLANEOUS) IMPLANT
DRAPE C-ARM 42X72 X-RAY (DRAPES) ×3 IMPLANT
DRAPE C-ARMOR (DRAPES) ×3 IMPLANT
DRAPE EXTREMITY T 121X128X90 (DISPOSABLE) ×3 IMPLANT
DRAPE IMP U-DRAPE 54X76 (DRAPES) ×3 IMPLANT
DRAPE U-SHAPE 47X51 STRL (DRAPES) ×3 IMPLANT
DRSG MEPILEX BORDER 4X4 (GAUZE/BANDAGES/DRESSINGS) ×3 IMPLANT
DRSG MEPILEX BORDER 4X8 (GAUZE/BANDAGES/DRESSINGS) IMPLANT
DRSG MEPITEL 4X7.2 (GAUZE/BANDAGES/DRESSINGS) ×3 IMPLANT
DRSG PAD ABDOMINAL 8X10 ST (GAUZE/BANDAGES/DRESSINGS) ×6 IMPLANT
ELECT REM PT RETURN 9FT ADLT (ELECTROSURGICAL) ×3
ELECTRODE REM PT RTRN 9FT ADLT (ELECTROSURGICAL) ×1 IMPLANT
GAUZE SPONGE 4X4 12PLY STRL (GAUZE/BANDAGES/DRESSINGS) ×3 IMPLANT
GLOVE BIO SURGEON STRL SZ8 (GLOVE) ×3 IMPLANT
GLOVE BIOGEL PI IND STRL 7.0 (GLOVE) IMPLANT
GLOVE BIOGEL PI IND STRL 8 (GLOVE) ×2 IMPLANT
GLOVE BIOGEL PI INDICATOR 7.0 (GLOVE) ×6
GLOVE BIOGEL PI INDICATOR 8 (GLOVE) ×4
GLOVE ECLIPSE 8.0 STRL XLNG CF (GLOVE) ×3 IMPLANT
GLOVE SURG SS PI 7.0 STRL IVOR (GLOVE) ×4 IMPLANT
GOWN STRL REUS W/ TWL LRG LVL3 (GOWN DISPOSABLE) ×1 IMPLANT
GOWN STRL REUS W/ TWL XL LVL3 (GOWN DISPOSABLE) ×1 IMPLANT
GOWN STRL REUS W/TWL LRG LVL3 (GOWN DISPOSABLE) ×6
GOWN STRL REUS W/TWL XL LVL3 (GOWN DISPOSABLE) ×6 IMPLANT
IMPL TALAR ANKLE SZ 4 RT (Ankle) IMPLANT
IMPLANT TALAR ANKLE SZ 4 RT (Ankle) ×3 IMPLANT
INSERT TIB FB ANKLE SZ 4X6 RT (Ankle) ×2 IMPLANT
NEEDLE HYPO 22GX1.5 SAFETY (NEEDLE) IMPLANT
NS IRRIG 1000ML POUR BTL (IV SOLUTION) ×3 IMPLANT
PACK BASIN DAY SURGERY FS (CUSTOM PROCEDURE TRAY) ×3 IMPLANT
PAD CAST 4YDX4 CTTN HI CHSV (CAST SUPPLIES) ×2 IMPLANT
PADDING CAST ABS 4INX4YD NS (CAST SUPPLIES)
PADDING CAST ABS COTTON 4X4 ST (CAST SUPPLIES) IMPLANT
PADDING CAST COTTON 4X4 STRL (CAST SUPPLIES) ×6
PADDING CAST COTTON 6X4 STRL (CAST SUPPLIES) ×3 IMPLANT
PENCIL SMOKE EVACUATOR (MISCELLANEOUS) ×3 IMPLANT
PIN POUCH TALAR VANTAGE 2.0 (PIN) ×2 IMPLANT
PIN POUCH TALAR VANTAGE 2.5 (PIN) ×2 IMPLANT
PIN POUCH TALAR VANTAGE 3.5 (PIN) ×4 IMPLANT
PLATE VANTAGE TOTAL ANKLE SZ4 (Plate) ×2 IMPLANT
POUCH SCREW TALAR ANKLE (ORTHOPEDIC DISPOSABLE SUPPLIES) ×2 IMPLANT
SANITIZER HAND PURELL 535ML FO (MISCELLANEOUS) ×3 IMPLANT
SCOTCHCAST PLUS 3X4 WHITE (CAST SUPPLIES) IMPLANT
SCOTCHCAST PLUS 4X4 WHITE (CAST SUPPLIES) ×6 IMPLANT
SHEET MEDIUM DRAPE 40X70 STRL (DRAPES) ×3 IMPLANT
SLEEVE SCD COMPRESS KNEE MED (MISCELLANEOUS) ×3 IMPLANT
SPONGE LAP 18X18 RF (DISPOSABLE) ×5 IMPLANT
STOCKINETTE 6  STRL (DRAPES) ×3
STOCKINETTE 6 STRL (DRAPES) ×1 IMPLANT
SUCTION FRAZIER HANDLE 10FR (MISCELLANEOUS) ×3
SUCTION TUBE FRAZIER 10FR DISP (MISCELLANEOUS) ×1 IMPLANT
SURGILUBE 2OZ TUBE FLIPTOP (MISCELLANEOUS) ×3 IMPLANT
SUT ETHILON 3 0 PS 1 (SUTURE) ×6 IMPLANT
SUT MNCRL AB 3-0 PS2 18 (SUTURE) ×6 IMPLANT
SUT VIC AB 0 CT1 27 (SUTURE) ×3
SUT VIC AB 0 CT1 27XBRD ANBCTR (SUTURE) ×1 IMPLANT
SUT VIC AB 2-0 SH 27 (SUTURE)
SUT VIC AB 2-0 SH 27XBRD (SUTURE) IMPLANT
SYR 20ML LL LF (SYRINGE) IMPLANT
SYR BULB EAR ULCER 3OZ GRN STR (SYRINGE) ×2 IMPLANT
SYR BULB IRRIG 60ML STRL (SYRINGE) ×3 IMPLANT
TOWEL GREEN STERILE FF (TOWEL DISPOSABLE) ×6 IMPLANT
TUBE CONNECTING 20'X1/4 (TUBING) ×1
TUBE CONNECTING 20X1/4 (TUBING) ×2 IMPLANT
UNDERPAD 30X36 HEAVY ABSORB (UNDERPADS AND DIAPERS) ×3 IMPLANT
YANKAUER SUCT BULB TIP NO VENT (SUCTIONS) ×3 IMPLANT

## 2020-04-10 NOTE — Transfer of Care (Signed)
Immediate Anesthesia Transfer of Care Note  Patient: Chad Hernandez  Procedure(s) Performed: TOTAL ANKLE ARTHROPLASTY (Right Ankle)  Patient Location: PACU  Anesthesia Type:GA combined with regional for post-op pain  Level of Consciousness: drowsy and patient cooperative  Airway & Oxygen Therapy: Patient Spontanous Breathing and Patient connected to face mask oxygen  Post-op Assessment: Report given to RN and Post -op Vital signs reviewed and stable  Post vital signs: Reviewed and stable  Last Vitals:  Vitals Value Taken Time  BP    Temp    Pulse 86 04/10/20 1031  Resp    SpO2 99 % 04/10/20 1031  Vitals shown include unvalidated device data.  Last Pain:  Vitals:   04/10/20 0701  TempSrc: Oral  PainSc: 2       Patients Stated Pain Goal: 5 (04/10/20 0701)  Complications: No complications documented.

## 2020-04-10 NOTE — Anesthesia Procedure Notes (Signed)
Procedure Name: LMA Insertion Date/Time: 04/10/2020 7:45 AM Performed by: Sheryn Bison, CRNA Pre-anesthesia Checklist: Patient identified, Emergency Drugs available, Suction available and Patient being monitored Patient Re-evaluated:Patient Re-evaluated prior to induction Oxygen Delivery Method: Circle system utilized Preoxygenation: Pre-oxygenation with 100% oxygen Induction Type: IV induction Ventilation: Mask ventilation without difficulty LMA: LMA inserted LMA Size: 4.0 Number of attempts: 1 Airway Equipment and Method: Bite block Placement Confirmation: positive ETCO2 Tube secured with: Tape Dental Injury: Teeth and Oropharynx as per pre-operative assessment

## 2020-04-10 NOTE — Op Note (Signed)
04/10/2020  10:40 AM  PATIENT:  Chad Hernandez  49 y.o. male  PRE-OPERATIVE DIAGNOSIS: Right ankle posttraumatic arthritis  POST-OPERATIVE DIAGNOSIS: 1.  Right ankle posttraumatic arthritis 2.  Right ankle short Achilles tendon  Procedure(s): 1.  Right total ankle replacement   2.  Percutaneous right Achilles tendon lengthening  SURGEON:  Toni Arthurs, MD  ASSISTANT: Alfredo Martinez, PA-C  ANESTHESIA:   General, regional  EBL:  minimal   TOURNIQUET:   Total Tourniquet Time Documented: Thigh (Right) - 142 minutes Total: Thigh (Right) - 142 minutes  COMPLICATIONS:  None apparent  DISPOSITION:  Extubated, awake and stable to recovery.  INDICATION FOR PROCEDURE: The patient is a 49 year old male with a long history of right ankle pain due to posttraumatic arthritis.  He has failed nonoperative treatment to date including activity modification, oral anti-inflammatories, bracing and shoewear modification.  He presents now for operative treatment of this painful and limiting condition.  The risks and benefits of the alternative treatment options have been discussed in detail.  The patient wishes to proceed with surgery and specifically understands risks of bleeding, infection, nerve damage, blood clots, need for additional surgery, amputation and death.  PROCEDURE IN DETAIL:  After pre operative consent was obtained, and the correct operative site was identified, the patient was brought to the operating room and placed supine on the OR table.  Anesthesia was administered.  Pre-operative antibiotics were administered.  A surgical timeout was taken.  The right lower extremity was prepped and draped in standard sterile fashion with a tourniquet around the thigh.  The extremity was elevated and the tourniquet inflated to 250 mmHg.  An anterior incision was made over the ankle joint in line with the EHL tendon.  Dissection was carried sharply down to the subcutaneous tissues to the extensor  retinaculum.  The retinaculum was incised over the EHL and released proximally and distally.  The interval between the EHL and the tibialis anterior was developed.  The neurovascular bundle was identified.  The anterior joint capsule was incised and elevated medially and laterally exposing the ankle joint.  Large osteophytes were removed with a rondure and osteotome exposing the joint line.  A guidepin was placed in the tibial tubercle cutaneously in line with the medial gutter.  The external alignment guide was then applied and rotation set in line with the gutter.  AP and lateral radiographs were used to align the cutting guide appropriately with the joint.  The cutting guide was positioned distally to take an extra millimeter of bone due to the patient's tightness.  The guide was then aligned with the medial gutter and pinned into position.  The distal tibial cut was made with the oscillating saw taking care to protect the posterior neurovascular structures as well as the FHL tendon.  The distal cut bone was morselized and removed piecemeal.  The guide was then positioned over the talus.  It was pinned into position after a lateral fluoroscopic view confirmed appropriate position.  The talar cut was made in the waist bone removed.  The size 4 block was confirmed to fit appropriately.  The size 4 talar lollipop was positioned and pinned.  Radiographs confirmed appropriate position.  The lollipop was removed and the talar cutting guide positioned appropriately and pinned.  The posterior cut was made followed by the anterior middle slots.  Finally the anterior cut was made.  All of the waist bone was removed.  The anterior talar neck osteophytes were removed with a rondure.  The cut surface of bone was smoothed with a curved rasp.  The wound was irrigated copiously.  A size 4 talar trial was implanted and centered.  The screw was used to insert it and hold the position.  The tibial cut was measured and the size 4  tibial trial inserted.  The trial liner was inserted as well.  The talar lug holes were drilled.  The talar trial was removed.  The tibial trial was pinned after positioning it appropriately.  The peripheral and central lug holes were punched.  The tibial trial was removed.  The wound was again irrigated copiously.  The size 4 tibial implant was inserted after sprinkling with vancomycin powder.  It was impacted into position and was noted to fit appropriately.  The talar implant was then inserted in the same fashion and impacted into position.  A trial 6 mm polywas inserted.  The ankle was noted to be very tight posteriorly.  A percutaneous tendo Achilles lengthening was performed with a #15 blade.  This allowed appropriate dorsiflexion of the ankle.  The medial and lateral soft tissues were released to allow better positioning of the implants.  The gutters were cleaned of soft tissue with a rondure.  The wound was again irrigated copiously.  A 6 mm polywas inserted and the locking clip inserted appropriately.  Final AP, mortise and lateral radiographs confirmed appropriate position and alignment of the implants.  The anterior joint capsule was repaired with 0 Vicryl.  The extensor retinaculum was repaired with 0 Vicryl.  Subcutaneous tissues were approximated with 3-0 Monocryl.  Skin incision was closed with 3-0 nylon.  Sterile dressings were applied followed by a well-padded short leg cast.  The tourniquet was released after application of the dressings.  The patient was awakened from anesthesia and transported to the recovery room in stable condition.   FOLLOW UP PLAN: Observation overnight for pain control.  Nonweightbearing on the right lower extremity for 3 weeks.  Follow-up in the office at that time for suture removal and conversion to a cam boot and compression sock.  Aspirin for DVT prophylaxis.     Alfredo Martinez PA-C was present and scrubbed for the duration of the operative case. His assistance was  essential in positioning the patient, prepping and draping, gaining and maintaining exposure, performing the operation, closing and dressing the wounds and applying the splint.

## 2020-04-10 NOTE — Anesthesia Procedure Notes (Signed)
Anesthesia Regional Block: Popliteal block   Pre-Anesthetic Checklist: ,, timeout performed, Correct Patient, Correct Site, Correct Laterality, Correct Procedure, Correct Position, site marked, Risks and benefits discussed,  Surgical consent,  Pre-op evaluation,  At surgeon's request and post-op pain management  Laterality: Right and Lower  Prep: chloraprep       Needles:  Injection technique: Single-shot     Needle Length: 9cm  Needle Gauge: 22     Additional Needles: Arrow StimuQuik ECHO Echogenic Stimulating PNB Needle  Procedures:,,,, ultrasound used (permanent image in chart),,,,  Narrative:  Start time: 04/10/2020 7:25 AM End time: 04/10/2020 7:31 AM Injection made incrementally with aspirations every 5 mL.  Performed by: Personally  Anesthesiologist: Val Eagle, MD

## 2020-04-10 NOTE — H&P (Signed)
Chad Hernandez is an 49 y.o. male.   Chief Complaint: right ankle pain HPI: Patient is a 49 year old male without significant past medical history.  He has a long history of right ankle pain due to posttraumatic arthritis.  He has failed nonoperative treatment to date including activity modification, oral anti-inflammatories and bracing.  He presents now for operative treatment of this painful and limiting condition.  History reviewed. No pertinent past medical history.  Past Surgical History:  Procedure Laterality Date  . EYE SURGERY    . HAND SURGERY      Family History  Problem Relation Age of Onset  . Hyperlipidemia Mother   . Hypertension Mother   . Breast cancer Mother   . Hypertension Brother   . Diabetes Neg Hx   . Heart attack Neg Hx   . Sudden death Neg Hx   . Cancer Neg Hx   . COPD Neg Hx   . Early death Neg Hx   . Hearing loss Neg Hx   . Heart disease Neg Hx   . Kidney disease Neg Hx   . Stroke Neg Hx    Social History:  reports that he has been smoking cigars. He has never used smokeless tobacco. He reports current alcohol use of about 3.0 standard drinks of alcohol per week. He reports that he does not use drugs.  Allergies: No Known Allergies  Medications Prior to Admission  Medication Sig Dispense Refill  . Ibuprofen-Famotidine (DUEXIS) 800-26.6 MG TABS Take 1 tablet by mouth 3 (three) times daily. 90 tablet 1    Results for orders placed or performed during the hospital encounter of 04/25/2020 (from the past 48 hour(s))  Surgical pcr screen     Status: None   Collection Time: 04/09/20 11:00 AM   Specimen: Nasal Mucosa; Nasal Swab  Result Value Ref Range   MRSA, PCR NEGATIVE NEGATIVE   Staphylococcus aureus NEGATIVE NEGATIVE    Comment: (NOTE) The Xpert SA Assay (FDA approved for NASAL specimens in patients 46 years of age and older), is one component of a comprehensive surveillance program. It is not intended to diagnose infection nor to guide or  monitor treatment. Performed at [facility], [address] [place], [place] [address]    No results found.  Review of Systems no recent fever, chills, nausea, vomiting or changes in his appetite  Blood pressure (!) 135/87, pulse 82, temperature 98.6 F (37 C), temperature source Oral, resp. rate 17, height 6\' 1"  (1.854 m), weight (!) 99.4 kg, SpO2 99 %. Physical Exam  Well-nourished well-developed and in no apparent distress.  Alert and oriented x4.  Normal mood and affect.  Gait is antalgic to the right.  Right ankle has no significant swelling.  There is a small effusion.  5 out of 5 strength in plantarflexion and dorsiflexion of the ankle and toes.  No lymphadenopathy.  Pulses are palpable in the foot.  Normal sensibility to light touch in the deep and superficial peroneal nerve distributions.  Assessment/Plan Right ankle posttraumatic arthritis -to the operating room today for right total ankle replacement.  The risks and benefits of the alternative treatment options have been discussed in detail.  The patient wishes to proceed with surgery and specifically understands risks of bleeding, infection, nerve damage, blood clots, need for additional surgery, amputation and death.   , MD 25-Apr-2020, 7:26 AM

## 2020-04-10 NOTE — Anesthesia Preprocedure Evaluation (Signed)
Anesthesia Evaluation  Patient identified by MRN, date of birth, ID band Patient awake    Reviewed: Allergy & Precautions, NPO status , Patient's Chart, lab work & pertinent test results  History of Anesthesia Complications Negative for: history of anesthetic complications  Airway Mallampati: II  TM Distance: >3 FB Neck ROM: Full    Dental  (+) Dental Advisory Given, Teeth Intact   Pulmonary neg shortness of breath, neg sleep apnea, neg COPD, neg recent URI, Current Smoker and Patient abstained from smoking.,    breath sounds clear to auscultation       Cardiovascular negative cardio ROS   Rhythm:Regular     Neuro/Psych negative neurological ROS  negative psych ROS   GI/Hepatic negative GI ROS, Neg liver ROS,   Endo/Other  negative endocrine ROS  Renal/GU negative Renal ROS     Musculoskeletal  (+) Arthritis ,   Abdominal   Peds  Hematology negative hematology ROS (+)   Anesthesia Other Findings   Reproductive/Obstetrics                             Anesthesia Physical Anesthesia Plan  ASA: II  Anesthesia Plan: General and Regional   Post-op Pain Management:  Regional for Post-op pain   Induction: Intravenous  PONV Risk Score and Plan: 1 and Ondansetron and Dexamethasone  Airway Management Planned: LMA  Additional Equipment: None  Intra-op Plan:   Post-operative Plan: Extubation in OR  Informed Consent: I have reviewed the patients History and Physical, chart, labs and discussed the procedure including the risks, benefits and alternatives for the proposed anesthesia with the patient or authorized representative who has indicated his/her understanding and acceptance.     Dental advisory given  Plan Discussed with: CRNA and Surgeon  Anesthesia Plan Comments:         Anesthesia Quick Evaluation

## 2020-04-10 NOTE — Discharge Instructions (Signed)
Toni Arthurs, MD [facility]  Please read the following information regarding your care after surgery.  Medications  You only need a prescription for the narcotic pain medicine (ex. oxycodone, Percocet, Norco).  All of the other medicines listed below are available over the counter. X Aleve 2 pills twice a day (or duexis that you have at home as prescribed) for the first 3 days after surgery. X acetominophen (Tylenol) 650 mg every 4-6 hours as you need for minor to moderate pain X oxycodone as prescribed for severe pain  Narcotic pain medicine (ex. oxycodone, Percocet, Vicodin) will cause constipation.  To prevent this problem, take the following medicines while you are taking any pain medicine. X docusate sodium (Colace) 100 mg twice a day X senna (Senokot) 2 tablets twice a day  X To help prevent blood clots, take a baby aspirin (81 mg) twice a day for three weeks after surgery.  You should also get up every hour while you are awake to move around.    Weight Bearing X Do not bear any weight on the operated leg or foot.  Cast / Splint / Dressing X Keep your splint, cast or dressing clean and dry.  Don't put anything (coat hanger, pencil, etc) down inside of it.  If it gets damp, use a hair dryer on the cool setting to dry it.  If it gets soaked, call the office to schedule an appointment for a cast change.   After your dressing, cast or splint is removed; you may shower, but do not soak or scrub the wound.  Allow the water to run over it, and then gently pat it dry.  Swelling It is normal for you to have swelling where you had surgery.  To reduce swelling and pain, keep your toes above your nose for at least 3 days after surgery.  It may be necessary to keep your foot or leg elevated for several weeks.  If it hurts, it should be elevated.  Follow Up Call my office at [phone] when you are discharged from the hospital or surgery center to schedule an appointment to be seen two weeks  after surgery.  Call my office at [phone] if you develop a fever >101.5 F, nausea, vomiting, bleeding from the surgical site or severe pain.

## 2020-04-10 NOTE — Anesthesia Procedure Notes (Signed)
Anesthesia Regional Block: Adductor canal block   Pre-Anesthetic Checklist: ,, timeout performed, Correct Patient, Correct Site, Correct Laterality, Correct Procedure, Correct Position, site marked, Risks and benefits discussed,  Surgical consent,  Pre-op evaluation,  At surgeon's request and post-op pain management  Laterality: Right and Lower  Prep: chloraprep       Needles:  Injection technique: Single-shot     Needle Length: 9cm  Needle Gauge: 22     Additional Needles: Arrow StimuQuik ECHO Echogenic Stimulating PNB Needle  Procedures:,,,, ultrasound used (permanent image in chart),,,,  Narrative:  Start time: 04/10/2020 7:25 AM End time: 04/10/2020 7:31 AM Injection made incrementally with aspirations every 5 mL.  Performed by: Personally  Anesthesiologist: Val Eagle, MD

## 2020-04-10 NOTE — Progress Notes (Signed)
Assisted Dr. Maple Hudson with right, ultrasound guided, popliteal, adductor canal block. Side rails up, monitors on throughout procedure. See vital signs in flow sheet. Tolerated Procedure well.

## 2020-04-10 NOTE — Anesthesia Postprocedure Evaluation (Signed)
Anesthesia Post Note  Patient: Chad Hernandez  Procedure(s) Performed: TOTAL ANKLE ARTHROPLASTY (Right Ankle)     Patient location during evaluation: PACU Anesthesia Type: Regional and General Level of consciousness: awake and alert Pain management: pain level controlled Vital Signs Assessment: post-procedure vital signs reviewed and stable Respiratory status: spontaneous breathing, nonlabored ventilation, respiratory function stable and patient connected to nasal cannula oxygen Cardiovascular status: blood pressure returned to baseline and stable Postop Assessment: no apparent nausea or vomiting Anesthetic complications: no   No complications documented.  Last Vitals:  Vitals:   04/10/20 1300 04/10/20 1359  BP: (!) 134/76 (!) 138/72  Pulse: 75 77  Resp: 18 20  Temp: (!) 36.2 C   SpO2: 98% 97%    Last Pain:  Vitals:   04/10/20 1359  TempSrc:   PainSc: Asleep                 Marcial Pless

## 2020-04-11 DIAGNOSIS — M19171 Post-traumatic osteoarthritis, right ankle and foot: Secondary | ICD-10-CM | POA: Diagnosis not present

## 2020-04-11 NOTE — Discharge Summary (Signed)
Physician Discharge Summary  Patient ID: Chad Hernandez MRN: [id number] DOB/AGE: 1970-10-24 49 y.o.  Admit date: 04/10/2020 Discharge date: 04/11/2020  Admission Diagnoses: Right ankle OA; snoring, sleep apnea  Discharge Diagnoses:  Active Problems:   H/O total ankle replacement, right same as above  Discharged Condition: stable  Hospital Course: Patient presented to [facility] day outpatient surgery on April 11, 2020.  He had elective right total ankle replacement by Dr. Toni Arthurs.  He tolerated the procedure well without any complications.  He was admitted to observation and stayed overnight.  He tolerated his stay well without any complications.  He was discharged home on April 11, 2020.  Consults: N/A  Significant Diagnostic Studies: radiology: X-Ray: to ensure satisfactory anatomic alignment during operative procedure.  Treatments: IV hydration, antibiotics: Ancef, analgesia: acetaminophen, Dilaudid and oxycodone and surgery: as stated above  Discharge Exam: Blood pressure 118/71, pulse 89, temperature 98.2 F (36.8 C), resp. rate 16, height 6\' 1"  (1.854 m), weight (!) 99.4 kg, SpO2 98 %.   Disposition: Discharge disposition: 01-Home or Self Care       Discharge Instructions    Call MD / Call 911   Complete by: As directed    If you experience chest pain or shortness of breath, CALL 911 and be transported to the hospital emergency room.  If you develope a fever above 101 F, pus (white drainage) or increased drainage or redness at the wound, or calf pain, call your surgeon's office.   Constipation Prevention   Complete by: As directed    Drink plenty of fluids.  Prune juice may be helpful.  You may use a stool softener, such as Colace (over the counter) 100 mg twice a day.  Use MiraLax (over the counter) for constipation as needed.   Diet - low sodium heart healthy   Complete by: As directed    Increase activity slowly as tolerated   Complete by: As directed       Allergies as of 04/11/2020   No Known Allergies     Medication List    TAKE these medications   aspirin EC 81 MG tablet Take 1 tablet (81 mg total) by mouth 2 (two) times daily.   docusate sodium 100 MG capsule Commonly known as: Colace Take 1 capsule (100 mg total) by mouth 2 (two) times daily. While taking narcotic pain medicine.   Duexis 800-26.6 MG Tabs Generic drug: Ibuprofen-Famotidine Take 1 tablet by mouth 3 (three) times daily.   oxyCODONE 5 MG immediate release tablet Commonly known as: Roxicodone Take 1 tablet (5 mg total) by mouth every 4 (four) hours as needed for up to 5 days.   senna 8.6 MG Tabs tablet Commonly known as: SENOKOT Take 2 tablets (17.2 mg total) by mouth 2 (two) times daily.       Follow-up Information    04/13/2020, MD. Schedule an appointment as soon as possible for a visit in 3 weeks.   Specialty: Orthopedic Surgery Contact information: [address] [place] [address]               Signed: [phone] Office:  Lolly Mustache

## 2020-04-14 ENCOUNTER — Encounter (HOSPITAL_BASED_OUTPATIENT_CLINIC_OR_DEPARTMENT_OTHER): Payer: Self-pay | Admitting: Orthopedic Surgery

## 2020-04-29 ENCOUNTER — Other Ambulatory Visit: Payer: Self-pay

## 2020-04-29 ENCOUNTER — Ambulatory Visit (INDEPENDENT_AMBULATORY_CARE_PROVIDER_SITE_OTHER): Payer: BC Managed Care – PPO | Admitting: Family Medicine

## 2020-04-29 ENCOUNTER — Encounter: Payer: Self-pay | Admitting: Family Medicine

## 2020-04-29 VITALS — BP 136/88 | HR 87 | Temp 96.8°F | Ht 73.0 in | Wt 225.0 lb

## 2020-04-29 DIAGNOSIS — Z0000 Encounter for general adult medical examination without abnormal findings: Secondary | ICD-10-CM

## 2020-04-29 DIAGNOSIS — N3281 Overactive bladder: Secondary | ICD-10-CM | POA: Insufficient documentation

## 2020-04-29 DIAGNOSIS — R030 Elevated blood-pressure reading, without diagnosis of hypertension: Secondary | ICD-10-CM | POA: Diagnosis not present

## 2020-04-29 MED ORDER — OXYBUTYNIN CHLORIDE ER 5 MG PO TB24: 5.0000 mg | ORAL_TABLET | Freq: Every day | ORAL | 2 refills | Status: DC

## 2020-04-29 NOTE — Patient Instructions (Signed)
Health Maintenance, Male Adopting a healthy lifestyle and getting preventive care are important in promoting health and wellness. Ask your health care provider about:  The right schedule for you to have regular tests and exams.  Things you can do on your own to prevent diseases and keep yourself healthy. What should I know about diet, weight, and exercise? Eat a healthy diet   Eat a diet that includes plenty of vegetables, fruits, low-fat dairy products, and lean protein.  Do not eat a lot of foods that are high in solid fats, added sugars, or sodium. Maintain a healthy weight Body mass index (BMI) is a measurement that can be used to identify possible weight problems. It estimates body fat based on height and weight. Your health care provider can help determine your BMI and help you achieve or maintain a healthy weight. Get regular exercise Get regular exercise. This is one of the most important things you can do for your health. Most adults should:  Exercise for at least 150 minutes each week. The exercise should increase your heart rate and make you sweat (moderate-intensity exercise).  Do strengthening exercises at least twice a week. This is in addition to the moderate-intensity exercise.  Spend less time sitting. Even light physical activity can be beneficial. Watch cholesterol and blood lipids Have your blood tested for lipids and cholesterol at 51 years of age, then have this test every 5 years. You may need to have your cholesterol levels checked more often if:  Your lipid or cholesterol levels are high.  You are older than 51 years of age.  You are at high risk for heart disease. What should I know about cancer screening? Many types of cancers can be detected early and may often be prevented. Depending on your health history and family history, you may need to have cancer screening at various ages. This may include screening for:  Colorectal cancer.  Prostate  cancer.  Skin cancer.  Lung cancer. What should I know about heart disease, diabetes, and high blood pressure? Blood pressure and heart disease  High blood pressure causes heart disease and increases the risk of stroke. This is more likely to develop in people who have high blood pressure readings, are of African descent, or are overweight.  Talk with your health care provider about your target blood pressure readings.  Have your blood pressure checked: ? Every 3-5 years if you are 18-39 years of age. ? Every year if you are 40 years old or older.  If you are between the ages of 65 and 75 and are a current or former smoker, ask your health care provider if you should have a one-time screening for abdominal aortic aneurysm (AAA). Diabetes Have regular diabetes screenings. This checks your fasting blood sugar level. Have the screening done:  Once every three years after age 45 if you are at a normal weight and have a low risk for diabetes.  More often and at a younger age if you are overweight or have a high risk for diabetes. What should I know about preventing infection? Hepatitis B If you have a higher risk for hepatitis B, you should be screened for this virus. Talk with your health care provider to find out if you are at risk for hepatitis B infection. Hepatitis C Blood testing is recommended for:  Everyone born from 1945 through 1965.  Anyone with known risk factors for hepatitis C. Sexually transmitted infections (STIs)  You should be screened each year   for STIs, including gonorrhea and chlamydia, if: ? You are sexually active and are younger than 51 years of age. ? You are older than 51 years of age and your health care provider tells you that you are at risk for this type of infection. ? Your sexual activity has changed since you were last screened, and you are at increased risk for chlamydia or gonorrhea. Ask your health care provider if you are at risk.  Ask your  health care provider about whether you are at high risk for HIV. Your health care provider may recommend a prescription medicine to help prevent HIV infection. If you choose to take medicine to prevent HIV, you should first get tested for HIV. You should then be tested every 3 months for as long as you are taking the medicine. Follow these instructions at home: Lifestyle  Do not use any products that contain nicotine or tobacco, such as cigarettes, e-cigarettes, and chewing tobacco. If you need help quitting, ask your health care provider.  Do not use street drugs.  Do not share needles.  Ask your health care provider for help if you need support or information about quitting drugs. Alcohol use  Do not drink alcohol if your health care provider tells you not to drink.  If you drink alcohol: ? Limit how much you have to 0-2 drinks a day. ? Be aware of how much alcohol is in your drink. In the [place] one drink equals one 12 oz bottle of beer (355 mL), one 5 oz glass of wine (148 mL), or one 1 oz glass of hard liquor (44 mL). General instructions  Schedule regular health, dental, and eye exams.  Stay current with your vaccines.  Tell your health care provider if: ? You often feel depressed. ? You have ever been abused or do not feel safe at home. Summary  Adopting a healthy lifestyle and getting preventive care are important in promoting health and wellness.  Follow your health care provider's instructions about healthy diet, exercising, and getting tested or screened for diseases.  Follow your health care provider's instructions on monitoring your cholesterol and blood pressure. This information is not intended to replace advice given to you by your health care provider. Make sure you discuss any questions you have with your health care provider. Document Revised: 08/23/2018 Document Reviewed: 08/23/2018 Elsevier Patient Education  [place].  Overactive Bladder,  Adult  Overactive bladder refers to a condition in which a person has a sudden need to pass urine. The person may leak urine if he or she cannot get to the bathroom fast enough (urinary incontinence). A person with this condition may also wake up several times in the night to go to the bathroom. Overactive bladder is associated with poor nerve signals between your bladder and your brain. Your bladder may get the signal to empty before it is full. You may also have very sensitive muscles that make your bladder squeeze too soon. These symptoms might interfere with daily work or social activities. What are the causes? This condition may be associated with or caused by:  Urinary tract infection.  Infection of nearby tissues, such as the prostate.  Prostate enlargement.  Surgery on the uterus or urethra.  Bladder stones, inflammation, or tumors.  Drinking too much caffeine or alcohol.  Certain medicines, especially medicines that get rid of extra fluid in the body (diuretics).  Muscle or nerve weakness, especially from: ? A spinal cord injury. ? Stroke. ? Multiple  sclerosis. ? Parkinson's disease.  Diabetes.  Constipation. What increases the risk? You may be at greater risk for overactive bladder if you:  Are an older adult.  Smoke.  Are going through menopause.  Have prostate problems.  Have a neurological disease, such as stroke, dementia, Parkinson's disease, or multiple sclerosis (MS).  Eat or drink things that irritate the bladder. These include alcohol, spicy food, and caffeine.  Are overweight or obese. What are the signs or symptoms? Symptoms of this condition include:  Sudden, strong urge to urinate.  Leaking urine.  Urinating 8 or more times a day.  Waking up to urinate 2 or more times a night. How is this diagnosed? Your health care provider may suspect overactive bladder based on your symptoms. He or she will diagnose this condition by:  A physical  exam and medical history.  Blood or urine tests. You might need bladder or urine tests to help determine what is causing your overactive bladder. You might also need to see a health care provider who specializes in urinary tract problems (urologist). How is this treated? Treatment for overactive bladder depends on the cause of your condition and whether it is mild or severe. You can also make lifestyle changes at home. Options include:  Bladder training. This may include: ? Learning to control the urge to urinate by following a schedule that directs you to urinate at regular intervals (timed voiding). ? Doing Kegel exercises to strengthen your pelvic floor muscles, which support your bladder. Toning these muscles can help you control urination, even if your bladder muscles are overactive.  Special devices. This may include: ? Biofeedback, which uses sensors to help you become aware of your body's signals. ? Electrical stimulation, which uses electrodes placed inside the body (implanted) or outside the body. These electrodes send gentle pulses of electricity to strengthen the nerves or muscles that control the bladder. ? Women may use a plastic device that fits into the vagina and supports the bladder (pessary).  Medicines. ? Antibiotics to treat bladder infection. ? Antispasmodics to stop the bladder from releasing urine at the wrong time. ? Tricyclic antidepressants to relax bladder muscles. ? Injections of botulinum toxin type A directly into the bladder tissue to relax bladder muscles.  Lifestyle changes. This may include: ? Weight loss. Talk to your health care provider about weight loss methods that would work best for you. ? Diet changes. This may include reducing how much alcohol and caffeine you consume, or drinking fluids at different times of the day. ? Not smoking. Do not use any products that contain nicotine or tobacco, such as cigarettes and e-cigarettes. If you need help  quitting, ask your health care provider.  Surgery. ? A device may be implanted to help manage the nerve signals that control urination. ? An electrode may be implanted to stimulate electrical signals in the bladder. ? A procedure may be done to change the shape of the bladder. This is done only in very severe cases. Follow these instructions at home: Lifestyle  Make any diet or lifestyle changes that are recommended by your health care provider. These may include: ? Drinking less fluid or drinking fluids at different times of the day. ? Cutting down on caffeine or alcohol. ? Doing Kegel exercises. ? Losing weight if needed. ? Eating a healthy and balanced diet to prevent constipation. This may include:  Eating foods that are high in fiber, such as fresh fruits and vegetables, whole grains, and beans.  Limiting  foods that are high in fat and processed sugars, such as fried and sweet foods. General instructions  Take over-the-counter and prescription medicines only as told by your health care provider.  If you were prescribed an antibiotic medicine, take it as told by your health care provider. Do not stop taking the antibiotic even if you start to feel better.  Use any implants or pessary as told by your health care provider.  If needed, wear pads to absorb urine leakage.  Keep a journal or log to track how much and when you drink and when you feel the need to urinate. This will help your health care provider monitor your condition.  Keep all follow-up visits as told by your health care provider. This is important. Contact a health care provider if:  You have a fever.  Your symptoms do not get better with treatment.  Your pain and discomfort get worse.  You have more frequent urges to urinate. Get help right away if:  You are not able to control your bladder. Summary  Overactive bladder refers to a condition in which a person has a sudden need to pass urine.  Several  conditions may lead to an overactive bladder.  Treatment for overactive bladder depends on the cause and severity of your condition.  Follow your health care provider's instructions about lifestyle changes, doing Kegel exercises, keeping a journal, and taking medicines. This information is not intended to replace advice given to you by your health care provider. Make sure you discuss any questions you have with your health care provider. Document Revised: 12/21/2018 Document Reviewed: 09/15/2017 Elsevier Patient Education  [place].  Preventing Hypertension Hypertension, commonly called high blood pressure, is when the force of blood pumping through the arteries is too strong. Arteries are blood vessels that carry blood from the heart throughout the body. Over time, hypertension can damage the arteries and decrease blood flow to important parts of the body, including the brain, heart, and kidneys. Often, hypertension does not cause symptoms until blood pressure is very high. For this reason, it is important to have your blood pressure checked on a regular basis. Hypertension can often be prevented with diet and lifestyle changes. If you already have hypertension, you can control it with diet and lifestyle changes, as well as medicine. What nutrition changes can be made? Maintain a healthy diet. This includes:  Eating less salt (sodium). Ask your health care provider how much sodium is safe for you to have. The general recommendation is to consume less than 1 tsp (2,300 mg) of sodium a day. ? Do not add salt to your food. ? Choose low-sodium options when grocery shopping and eating out.  Limiting fats in your diet. You can do this by eating low-fat or fat-free dairy products and by eating less red meat.  Eating more fruits, vegetables, and whole grains. Make a goal to eat: ? 1-2 cups of fresh fruits and vegetables each day. ? 3-4 servings of whole grains each day.  Avoiding foods  and beverages that have added sugars.  Eating fish that contain healthy fats (omega-3 fatty acids), such as mackerel or salmon. If you need help putting together a healthy eating plan, try the DASH diet. This diet is high in fruits, vegetables, and whole grains. It is low in sodium, red meat, and added sugars. DASH stands for Dietary Approaches to Stop Hypertension. What lifestyle changes can be made?   Lose weight if you are overweight. Losing just 3?5%  of your body weight can help prevent or control hypertension. ? For example, if your present weight is 200 lb (91 kg), a loss of 3-5% of your weight means losing 6-10 lb (2.7-4.5 kg). ? Ask your health care provider to help you with a diet and exercise plan to safely lose weight.  Get enough exercise. Do at least 150 minutes of moderate-intensity exercise each week. ? You could do this in short exercise sessions several times a day, or you could do longer exercise sessions a few times a week. For example, you could take a brisk 10-minute walk or bike ride, 3 times a day, for 5 days a week.  Find ways to reduce stress, such as exercising, meditating, listening to music, or taking a yoga class. If you need help reducing stress, ask your health care provider.  Do not smoke. This includes e-cigarettes. Chemicals in tobacco and nicotine products raise your blood pressure each time you smoke. If you need help quitting, ask your health care provider.  Avoid alcohol. If you drink alcohol, limit alcohol intake to no more than 1 drink a day for nonpregnant women and 2 drinks a day for men. One drink equals 12 oz of beer, 5 oz of wine, or 1 oz of hard liquor. Why are these changes important? Diet and lifestyle changes can help you prevent hypertension, and they may make you feel better overall and improve your quality of life. If you have hypertension, making these changes will help you control it and help prevent major complications, such as:  Hardening  and narrowing of arteries that supply blood to: ? Your heart. This can cause a heart attack. ? Your brain. This can cause a stroke. ? Your kidneys. This can cause kidney failure.  Stress on your heart muscle, which can cause heart failure. What can I do to lower my risk?  Work with your health care provider to make a hypertension prevention plan that works for you. Follow your plan and keep all follow-up visits as told by your health care provider.  Learn how to check your blood pressure at home. Make sure that you know your personal target blood pressure, as told by your health care provider. How is this treated? In addition to diet and lifestyle changes, your health care provider may recommend medicines to help lower your blood pressure. You may need to try a few different medicines to find what works best for you. You also may need to take more than one medicine. Take over-the-counter and prescription medicines only as told by your health care provider. Where to find support Your health care provider can help you prevent hypertension and help you keep your blood pressure at a healthy level. Your local hospital or your community may also provide support services and prevention programs. The American Heart Association offers an online support network at: CheapBootlegs.com.cy Where to find more information Learn more about hypertension from:  [place], Lung, and Blood Institute: ElectronicHangman.is  Centers for Disease Control and Prevention: https://ingram.com/  American Academy of Family Physicians: http://familydoctor.org/familydoctor/en/diseases-conditions/high-blood-pressure.printerview.all.html Learn more about the DASH diet from:  [place], Lung, and [place]: https://www.reyes.com/ Contact a health care provider if:  You think you are having a reaction to medicines  you have taken.  You have recurrent headaches or feel dizzy.  You have swelling in your ankles.  You have trouble with your vision. Summary  Hypertension often does not cause any symptoms until blood pressure is very high. It is important  to get your blood pressure checked regularly.  Diet and lifestyle changes are the most important steps in preventing hypertension.  By keeping your blood pressure in a healthy range, you can prevent complications like heart attack, heart failure, stroke, and kidney failure.  Work with your health care provider to make a hypertension prevention plan that works for you. This information is not intended to replace advice given to you by your health care provider. Make sure you discuss any questions you have with your health care provider. Document Revised: 12/22/2018 Document Reviewed: 05/10/2016 Elsevier Patient Education  2020 Else[employer] Inc.  Preventive Care 101-67 Years Old, Male Preventive care refers to lifestyle choices and visits with your health care provider that can promote health and wellness. This includes:  A yearly physical exam. This is also called an annual well check.  Regular dental and eye exams.  Immunizations.  Screening for certain conditions.  Healthy lifestyle choices, such as eating a healthy diet, getting regular exercise, not using drugs or products that contain nicotine and tobacco, and limiting alcohol use. What can I expect for my preventive care visit? Physical exam Your health care provider will check:  Height and weight. These may be used to calculate body mass index (BMI), which is a measurement that tells if you are at a healthy weight.  Heart rate and blood pressure.  Your skin for abnormal spots. Counseling Your health care provider may ask you questions about:  Alcohol, tobacco, and drug use.  Emotional well-being.  Home and relationship well-being.  Sexual activity.  Eating habits.  Work and  work Statistician. What immunizations do I need?  Influenza (flu) vaccine  This is recommended every year. Tetanus, diphtheria, and pertussis (Tdap) vaccine  You may need a Td booster every 10 years. Varicella (chickenpox) vaccine  You may need this vaccine if you have not already been vaccinated. Zoster (shingles) vaccine  You may need this after age 10. Measles, mumps, and rubella (MMR) vaccine  You may need at least one dose of MMR if you were born in 1957 or later. You may also need a second dose. Pneumococcal conjugate (PCV13) vaccine  You may need this if you have certain conditions and were not previously vaccinated. Pneumococcal polysaccharide (PPSV23) vaccine  You may need one or two doses if you smoke cigarettes or if you have certain conditions. Meningococcal conjugate (MenACWY) vaccine  You may need this if you have certain conditions. Hepatitis A vaccine  You may need this if you have certain conditions or if you travel or work in places where you may be exposed to hepatitis A. Hepatitis B vaccine  You may need this if you have certain conditions or if you travel or work in places where you may be exposed to hepatitis B. Haemophilus influenzae type b (Hib) vaccine  You may need this if you have certain risk factors. Human papillomavirus (HPV) vaccine  If recommended by your health care provider, you may need three doses over 6 months. You may receive vaccines as individual doses or as more than one vaccine together in one shot (combination vaccines). Talk with your health care provider about the risks and benefits of combination vaccines. What tests do I need? Blood tests  Lipid and cholesterol levels. These may be checked every 5 years, or more frequently if you are over 65 years old.  Hepatitis C test.  Hepatitis B test. Screening  Lung cancer screening. You may have this screening every year starting at age 66  if you have a 30-pack-year history of  smoking and currently smoke or have quit within the past 15 years.  Prostate cancer screening. Recommendations will vary depending on your family history and other risks.  Colorectal cancer screening. All adults should have this screening starting at age 14 and continuing until age 57. Your health care provider may recommend screening at age 32 if you are at increased risk. You will have tests every 1-10 years, depending on your results and the type of screening test.  Diabetes screening. This is done by checking your blood sugar (glucose) after you have not eaten for a while (fasting). You may have this done every 1-3 years.  Sexually transmitted disease (STD) testing. Follow these instructions at home: Eating and drinking  Eat a diet that includes fresh fruits and vegetables, whole grains, lean protein, and low-fat dairy products.  Take vitamin and mineral supplements as recommended by your health care provider.  Do not drink alcohol if your health care provider tells you not to drink.  If you drink alcohol: ? Limit how much you have to 0-2 drinks a day. ? Be aware of how much alcohol is in your drink. In the [place] one drink equals one 12 oz bottle of beer (355 mL), one 5 oz glass of wine (148 mL), or one 1 oz glass of hard liquor (44 mL). Lifestyle  Take daily care of your teeth and gums.  Stay active. Exercise for at least 30 minutes on 5 or more days each week.  Do not use any products that contain nicotine or tobacco, such as cigarettes, e-cigarettes, and chewing tobacco. If you need help quitting, ask your health care provider.  If you are sexually active, practice safe sex. Use a condom or other form of protection to prevent STIs (sexually transmitted infections).  Talk with your health care provider about taking a low-dose aspirin every day starting at age 38. What's next?  Go to your health care provider once a year for a well check visit.  Ask your health care provider  how often you should have your eyes and teeth checked.  Stay up to date on all vaccines. This information is not intended to replace advice given to you by your health care provider. Make sure you discuss any questions you have with your health care provider. Document Revised: 08/24/2018 Document Reviewed: 08/24/2018 Elsevier Patient Education  2020 [employer].

## 2020-04-29 NOTE — Progress Notes (Signed)
New Patient Office Visit  Subjective:  Patient ID: Chad Hernandez, male    DOB: 13-Apr-1971  Age: 49 y.o. MRN: [id number]  CC:  Chief Complaint  Patient presents with  . Annual Exam    CPE, pt would ike prostate check due to frequent urinating.     HPI Chad Hernandez presents for routine yearly physical.  Accompanied by his wife.  Has been doing well.  Recent total right ankle arthroplasty.  Recovering well from the surgery.  Hopes to bear weight this coming Friday.  Has not been able to see his dentist for apnea device.  Did go for a sleep study and the consultant had recommended an oral device in his case.  He would like to go for his 1st colonoscopy.  Has not had any change in his bowel habits.  Reports urinary frequency with urgency.  Has felt as though he would become incontinent but has not been.  Urine flow has been excellent.  There is no pre or post void dribble.  He rarely gets up at night to urinate.  No history of hypertension. History reviewed. No pertinent past medical history.  Past Surgical History:  Procedure Laterality Date  . EYE SURGERY    . HAND SURGERY    . 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 Hyperlipidemia Mother   . Hypertension Mother   . Breast cancer Mother   . Hypertension Brother   . Diabetes Neg Hx   . Heart attack Neg Hx   . Sudden death Neg Hx   . Cancer Neg Hx   . COPD Neg Hx   . Early death Neg Hx   . Hearing loss Neg Hx   . Heart disease Neg Hx   . Kidney disease Neg Hx   . Stroke Neg Hx     Social History   Socioeconomic History  . Marital status: Married    Spouse name: Not on file  . Number of children: Not on file  . Years of education: Not on file  . Highest education level: Not on file  Occupational History  . Not on  file  Tobacco Use  . Smoking status: Current Some Day Smoker    Types: Cigars  . Smokeless tobacco: Never Used  . Tobacco comment: occassionally smokes cigars  Substance and Sexual Activity  . Alcohol use: Yes    Alcohol/week: 3.0 standard drinks    Types: 3 Cans of beer per week    Comment: occassional  . Drug use: No  . Sexual activity: Yes  Other Topics Concern  . Not on file  Social History Narrative  . Not on file   Social Determinants of Health   Financial Resource Strain:   . Difficulty of Paying Living Expenses:   Food Insecurity:   . Worried About Programme researcher, broadcasting/film/video in the Last Year:   . Barista in the Last Year:   Transportation Needs:   . Freight forwarder (Medical):   Marland Kitchen Lack of Transportation (Non-Medical):   Physical Activity:   . Days of Exercise per Week:   . Minutes of Exercise per Session:   Stress:   . Feeling of Stress :   Social Connections:   . Frequency of Communication with Friends and Family:   .  Frequency of Social Gatherings with Friends and Family:   . Attends Religious Services:   . Active Member of Clubs or Organizations:   . Attends Banker Meetings:   Marland Kitchen Marital Status:   Intimate Partner Violence:   . Fear of Current or Ex-Partner:   . Emotionally Abused:   Marland Kitchen Physically Abused:   . Sexually Abused:     ROS Review of Systems  Constitutional: Negative.   HENT: Negative.   Eyes: Negative for photophobia and visual disturbance.  Respiratory: Negative.   Cardiovascular: Negative.   Gastrointestinal: Negative.   Endocrine: Negative for polyphagia and polyuria.  Genitourinary: Positive for frequency and urgency. Negative for decreased urine volume, difficulty urinating, discharge, dysuria and flank pain.  Musculoskeletal: Positive for arthralgias and gait problem.  Skin: Negative for pallor and rash.  Neurological: Negative for light-headedness and headaches.  Hematological: Does not bruise/bleed easily.   Psychiatric/Behavioral: Negative.    Depression screen [facility] 2/9 04/29/2020  Decreased Interest 0  Down, Depressed, Hopeless 0  PHQ - 2 Score 0    Objective:   Today's Vitals: BP 136/88   Pulse 87   Temp (!) 96.8 F (36 C) (Tympanic)   Ht 6\' 1"  (1.854 m)   Wt 225 lb (102.1 kg) Comment: weighed with boot  SpO2 97%   BMI 29.69 kg/m   Physical Exam Constitutional:      General: He is not in acute distress.    Appearance: Normal appearance. He is normal weight. He is not ill-appearing, toxic-appearing or diaphoretic.  HENT:     Head: Normocephalic and atraumatic.     Right Ear: Tympanic membrane, ear canal and external ear normal.     Left Ear: Tympanic membrane, ear canal and external ear normal.  Eyes:     General: No scleral icterus.       Right eye: No discharge.        Left eye: No discharge.     Extraocular Movements: Extraocular movements intact.     Conjunctiva/sclera: Conjunctivae normal.     Pupils: Pupils are equal, round, and reactive to light.  Cardiovascular:     Rate and Rhythm: Normal rate and regular rhythm.  Pulmonary:     Effort: Pulmonary effort is normal.     Breath sounds: Normal breath sounds.  Abdominal:     General: Abdomen is flat. Bowel sounds are normal. There is no distension.     Palpations: There is no mass.     Tenderness: There is no abdominal tenderness. There is no guarding or rebound.     Hernia: No hernia is present.  Genitourinary:    Prostate: Enlarged. Not tender and no nodules present.     Rectum: Guaiac result negative. No mass, tenderness, anal fissure, external hemorrhoid or internal hemorrhoid. Normal anal tone.  Musculoskeletal:     Cervical back: Normal range of motion. No rigidity or tenderness.     Right lower leg: No edema.     Left lower leg: No edema.  Lymphadenopathy:     Cervical: No cervical adenopathy.  Skin:    General: Skin is warm and dry.  Neurological:     Mental Status: He is alert and oriented to  person, place, and time.  Psychiatric:        Mood and Affect: Mood normal.        Behavior: Behavior normal.     Assessment & Plan:   Problem List Items Addressed This Visit  Genitourinary   OAB (overactive bladder)   Relevant Medications   oxybutynin (DITROPAN XL) 5 MG 24 hr tablet   Other Relevant Orders   PSA   Urinalysis, Routine w reflex microscopic     Other   Healthcare maintenance - Primary   Relevant Orders   CBC   Comprehensive metabolic panel   LDL cholesterol, direct   Lipid panel   PSA   Ambulatory referral to Gastroenterology   Elevated BP without diagnosis of hypertension   Relevant Orders   CBC   Comprehensive metabolic panel      Outpatient Encounter Medications as of 04/29/2020  Medication Sig  . aspirin EC 81 MG tablet Take 1 tablet (81 mg total) by mouth 2 (two) times daily.  . Ibuprofen-Famotidine (DUEXIS) 800-26.6 MG TABS Take 1 tablet by mouth 3 (three) times daily.  Marland Kitchen docusate sodium (COLACE) 100 MG capsule Take 1 capsule (100 mg total) by mouth 2 (two) times daily. While taking narcotic pain medicine. (Patient not taking: Reported on 04/29/2020)  . oxybutynin (DITROPAN XL) 5 MG 24 hr tablet Take 1 tablet (5 mg total) by mouth at bedtime.  . senna (SENOKOT) 8.6 MG TABS tablet Take 2 tablets (17.2 mg total) by mouth 2 (two) times daily. (Patient not taking: Reported on 04/29/2020)   No facility-administered encounter medications on file as of 04/29/2020.    Follow-up: Return in about 3 months (around 07/30/2020).   Given information on health maintenance disease prevention as well as preventing hypertension and overactive bladder.  Trial of Ditropan XL.  Follow-up in 3 months to recheck blood pressure and overactive bladder symptoms.  Let me know if there are problems with the medication.  Mliss Sax, MD

## 2020-05-01 LAB — COMPREHENSIVE METABOLIC PANEL
ALT: 27 U/L (ref 0–53)
AST: 16 U/L (ref 0–37)
Albumin: 4.3 g/dL (ref 3.5–5.2)
Alkaline Phosphatase: 118 U/L — ABNORMAL HIGH (ref 39–117)
BUN: 10 mg/dL (ref 6–23)
CO2: 25 mEq/L (ref 19–32)
Calcium: 9.6 mg/dL (ref 8.4–10.5)
Chloride: 103 mEq/L (ref 96–112)
Creatinine, Ser: 1.03 mg/dL (ref 0.40–1.50)
GFR: 92.1 mL/min (ref >60.00)
Glucose, Bld: 99 mg/dL (ref 70–99)
Potassium: 3.9 mEq/L (ref 3.5–5.1)
Sodium: 138 mEq/L (ref 135–145)
Total Bilirubin: 0.5 mg/dL (ref 0.2–1.2)
Total Protein: 7.9 g/dL (ref 6.0–8.3)

## 2020-05-01 LAB — URINALYSIS, ROUTINE W REFLEX MICROSCOPIC
Bilirubin Urine: NEGATIVE
Hgb urine dipstick: NEGATIVE
Ketones, ur: NEGATIVE
Leukocytes,Ua: NEGATIVE
Nitrite: NEGATIVE
RBC / HPF: NONE SEEN (ref 0–?)
Specific Gravity, Urine: >=1.03 — AB (ref 1.000–1.030)
Total Protein, Urine: NEGATIVE
Urine Glucose: NEGATIVE
Urobilinogen, UA: 0.2 (ref 0.0–1.0)
pH: 5.5 (ref 5.0–8.0)

## 2020-05-01 LAB — PSA: PSA: 0.59 ng/mL (ref 0.10–4.00)

## 2020-05-01 LAB — CBC
HCT: 44.2 % (ref 39.0–52.0)
Hemoglobin: 14.4 g/dL (ref 13.0–17.0)
MCHC: 32.7 g/dL (ref 30.0–36.0)
MCV: 95.2 fl (ref 78.0–100.0)
Platelets: 294 10*3/uL (ref 150.0–400.0)
RBC: 4.64 Mil/uL (ref 4.22–5.81)
RDW: 14.5 % (ref 11.5–15.5)
WBC: 7.1 10*3/uL (ref 4.0–10.5)

## 2020-05-01 LAB — LIPID PANEL
Cholesterol: 149 mg/dL (ref 0–200)
HDL: 37.8 mg/dL — ABNORMAL LOW (ref >39.00)
LDL Cholesterol: 91 mg/dL (ref 0–99)
NonHDL: 111.58
Total CHOL/HDL Ratio: 4
Triglycerides: 105 mg/dL (ref 0.0–149.0)
VLDL: 21 mg/dL (ref 0.0–40.0)

## 2020-05-01 LAB — LDL CHOLESTEROL, DIRECT: Direct LDL: 94 mg/dL

## 2020-05-01 NOTE — Addendum Note (Signed)
Addended by: Varney Biles on: 05/01/2020 07:26 AM   Modules accepted: Orders

## 2020-05-06 ENCOUNTER — Encounter: Payer: Self-pay | Admitting: Family Medicine

## 2020-05-12 ENCOUNTER — Encounter: Payer: Self-pay | Admitting: Gastroenterology

## 2020-06-10 ENCOUNTER — Other Ambulatory Visit: Payer: Self-pay | Admitting: Family Medicine

## 2020-06-10 DIAGNOSIS — M19071 Primary osteoarthritis, right ankle and foot: Secondary | ICD-10-CM

## 2020-06-16 ENCOUNTER — Other Ambulatory Visit: Payer: Self-pay

## 2020-06-16 ENCOUNTER — Encounter (HOSPITAL_BASED_OUTPATIENT_CLINIC_OR_DEPARTMENT_OTHER): Payer: Self-pay | Admitting: Orthopedic Surgery

## 2020-06-16 ENCOUNTER — Other Ambulatory Visit (HOSPITAL_COMMUNITY): Payer: Self-pay | Admitting: Orthopedic Surgery

## 2020-06-17 ENCOUNTER — Other Ambulatory Visit (HOSPITAL_COMMUNITY)
Admission: RE | Admit: 2020-06-17 | Discharge: 2020-06-17 | Disposition: A | Discharge status: Home / Self Care | Payer: BC Managed Care – PPO | Source: Ambulatory Visit | Attending: Orthopedic Surgery | Admitting: Orthopedic Surgery

## 2020-06-17 DIAGNOSIS — Z01812 Encounter for preprocedural laboratory examination: Secondary | ICD-10-CM | POA: Diagnosis present

## 2020-06-17 DIAGNOSIS — Z20822 Contact with and (suspected) exposure to covid-19: Secondary | ICD-10-CM | POA: Diagnosis not present

## 2020-06-17 LAB — SARS CORONAVIRUS 2 (TAT 6-24 HRS): SARS Coronavirus 2: NEGATIVE

## 2020-06-19 ENCOUNTER — Encounter (HOSPITAL_BASED_OUTPATIENT_CLINIC_OR_DEPARTMENT_OTHER): Payer: Self-pay | Admitting: Orthopedic Surgery

## 2020-06-19 ENCOUNTER — Encounter (HOSPITAL_BASED_OUTPATIENT_CLINIC_OR_DEPARTMENT_OTHER)
Admission: RE | Disposition: A | Discharge status: Home / Self Care | Payer: Self-pay | Source: Home / Self Care | Attending: Orthopedic Surgery

## 2020-06-19 ENCOUNTER — Other Ambulatory Visit: Payer: Self-pay

## 2020-06-19 ENCOUNTER — Ambulatory Visit (HOSPITAL_BASED_OUTPATIENT_CLINIC_OR_DEPARTMENT_OTHER): Payer: BC Managed Care – PPO | Admitting: Certified Registered Nurse Anesthetist

## 2020-06-19 ENCOUNTER — Ambulatory Visit (HOSPITAL_BASED_OUTPATIENT_CLINIC_OR_DEPARTMENT_OTHER)
Admission: RE | Admit: 2020-06-19 | Discharge: 2020-06-19 | Disposition: A | Discharge status: Home / Self Care | Payer: BC Managed Care – PPO | Attending: Orthopedic Surgery | Admitting: Orthopedic Surgery

## 2020-06-19 DIAGNOSIS — Z8249 Family history of ischemic heart disease and other diseases of the circulatory system: Secondary | ICD-10-CM | POA: Insufficient documentation

## 2020-06-19 DIAGNOSIS — K219 Gastro-esophageal reflux disease without esophagitis: Secondary | ICD-10-CM | POA: Diagnosis not present

## 2020-06-19 DIAGNOSIS — Z791 Long term (current) use of non-steroidal anti-inflammatories (NSAID): Secondary | ICD-10-CM | POA: Diagnosis not present

## 2020-06-19 DIAGNOSIS — X58XXXA Exposure to other specified factors, initial encounter: Secondary | ICD-10-CM | POA: Insufficient documentation

## 2020-06-19 DIAGNOSIS — F17290 Nicotine dependence, other tobacco product, uncomplicated: Secondary | ICD-10-CM | POA: Diagnosis not present

## 2020-06-19 DIAGNOSIS — Z803 Family history of malignant neoplasm of breast: Secondary | ICD-10-CM | POA: Insufficient documentation

## 2020-06-19 DIAGNOSIS — Y939 Activity, unspecified: Secondary | ICD-10-CM | POA: Diagnosis not present

## 2020-06-19 DIAGNOSIS — S8251XA Displaced fracture of medial malleolus of right tibia, initial encounter for closed fracture: Secondary | ICD-10-CM | POA: Diagnosis present

## 2020-06-19 DIAGNOSIS — Z96661 Presence of right artificial ankle joint: Secondary | ICD-10-CM | POA: Diagnosis not present

## 2020-06-19 HISTORY — DX: Gastro-esophageal reflux disease without esophagitis: K21.9

## 2020-06-19 HISTORY — PX: ORIF ANKLE FRACTURE: SHX5408

## 2020-06-19 HISTORY — DX: Nondisplaced fracture of medial malleolus of right tibia, subsequent encounter for closed fracture with routine healing: S82.54XD

## 2020-06-19 SURGERY — OPEN REDUCTION INTERNAL FIXATION (ORIF) ANKLE FRACTURE
Anesthesia: Regional | Site: Ankle | Laterality: Right

## 2020-06-19 MED ORDER — FENTANYL CITRATE (PF) 100 MCG/2ML IJ SOLN
50.0000 ug | Freq: Once | INTRAMUSCULAR | Status: AC
  Administered 2020-06-19: 100 ug via INTRAVENOUS

## 2020-06-19 MED ORDER — CEFAZOLIN SODIUM-DEXTROSE 2-4 GM/100ML-% IV SOLN
2.0000 g | INTRAVENOUS | Status: AC
  Administered 2020-06-19: 2 g via INTRAVENOUS

## 2020-06-19 MED ORDER — OXYCODONE HCL 5 MG PO TABS: 5.0000 mg | ORAL_TABLET | ORAL | 0 refills | Status: AC | PRN

## 2020-06-19 MED ORDER — PROPOFOL 500 MG/50ML IV EMUL
INTRAVENOUS | Status: AC
  Filled 2020-06-19: qty 100

## 2020-06-19 MED ORDER — BUPIVACAINE-EPINEPHRINE 0.5% -1:200000 IJ SOLN
INTRAMUSCULAR | Status: DC | PRN
  Administered 2020-06-19: 10 mL

## 2020-06-19 MED ORDER — AMISULPRIDE (ANTIEMETIC) 5 MG/2ML IV SOLN: 10.0000 mg | Freq: Once | INTRAVENOUS | Status: DC | PRN

## 2020-06-19 MED ORDER — MIDAZOLAM HCL 2 MG/2ML IJ SOLN
INTRAMUSCULAR | Status: AC
  Filled 2020-06-19: qty 2

## 2020-06-19 MED ORDER — ONDANSETRON HCL 4 MG/2ML IJ SOLN
INTRAMUSCULAR | Status: AC
  Filled 2020-06-19: qty 2

## 2020-06-19 MED ORDER — LIDOCAINE 2% (20 MG/ML) 5 ML SYRINGE
INTRAMUSCULAR | Status: AC
  Filled 2020-06-19: qty 5

## 2020-06-19 MED ORDER — DEXAMETHASONE SODIUM PHOSPHATE 10 MG/ML IJ SOLN
INTRAMUSCULAR | Status: DC | PRN
  Administered 2020-06-19: 10 mg via INTRAVENOUS

## 2020-06-19 MED ORDER — MIDAZOLAM HCL 2 MG/2ML IJ SOLN
1.0000 mg | Freq: Once | INTRAMUSCULAR | Status: AC
  Administered 2020-06-19: 2 mg via INTRAVENOUS

## 2020-06-19 MED ORDER — MEPERIDINE HCL 25 MG/ML IJ SOLN: 6.2500 mg | INTRAMUSCULAR | Status: DC | PRN

## 2020-06-19 MED ORDER — OXYCODONE HCL 5 MG PO TABS: 5.0000 mg | ORAL_TABLET | Freq: Once | ORAL | Status: DC | PRN

## 2020-06-19 MED ORDER — ASPIRIN EC 81 MG PO TBEC: 81.0000 mg | DELAYED_RELEASE_TABLET | Freq: Two times a day (BID) | ORAL | 0 refills | Status: DC

## 2020-06-19 MED ORDER — HYDROMORPHONE HCL 1 MG/ML IJ SOLN: 0.2500 mg | INTRAMUSCULAR | Status: DC | PRN

## 2020-06-19 MED ORDER — LIDOCAINE 2% (20 MG/ML) 5 ML SYRINGE
INTRAMUSCULAR | Status: DC | PRN
  Administered 2020-06-19: 80 mg via INTRAVENOUS

## 2020-06-19 MED ORDER — PROMETHAZINE HCL 25 MG/ML IJ SOLN: 6.2500 mg | INTRAMUSCULAR | Status: DC | PRN

## 2020-06-19 MED ORDER — FENTANYL CITRATE (PF) 100 MCG/2ML IJ SOLN
INTRAMUSCULAR | Status: AC
  Filled 2020-06-19: qty 2

## 2020-06-19 MED ORDER — OXYCODONE HCL 5 MG/5ML PO SOLN: 5.0000 mg | Freq: Once | ORAL | Status: DC | PRN

## 2020-06-19 MED ORDER — DOCUSATE SODIUM 100 MG PO CAPS: 100.0000 mg | ORAL_CAPSULE | Freq: Two times a day (BID) | ORAL | 0 refills | Status: DC

## 2020-06-19 MED ORDER — ONDANSETRON HCL 4 MG/2ML IJ SOLN
INTRAMUSCULAR | Status: DC | PRN
  Administered 2020-06-19: 4 mg via INTRAVENOUS

## 2020-06-19 MED ORDER — DEXAMETHASONE SODIUM PHOSPHATE 10 MG/ML IJ SOLN
INTRAMUSCULAR | Status: AC
  Filled 2020-06-19: qty 1

## 2020-06-19 MED ORDER — 0.9 % SODIUM CHLORIDE (POUR BTL) OPTIME
TOPICAL | Status: DC | PRN
  Administered 2020-06-19: 120 mL

## 2020-06-19 MED ORDER — FENTANYL CITRATE (PF) 100 MCG/2ML IJ SOLN
INTRAMUSCULAR | Status: DC | PRN
  Administered 2020-06-19: 25 ug via INTRAVENOUS
  Administered 2020-06-19: 50 ug via INTRAVENOUS
  Administered 2020-06-19: 25 ug via INTRAVENOUS
  Administered 2020-06-19: 50 ug via INTRAVENOUS
  Administered 2020-06-19 (×2): 25 ug via INTRAVENOUS

## 2020-06-19 MED ORDER — VANCOMYCIN HCL 500 MG IV SOLR
INTRAVENOUS | Status: DC | PRN
  Administered 2020-06-19: 500 mg via TOPICAL

## 2020-06-19 MED ORDER — EPHEDRINE 5 MG/ML INJ
INTRAVENOUS | Status: AC
  Filled 2020-06-19: qty 10

## 2020-06-19 MED ORDER — PROPOFOL 10 MG/ML IV BOLUS
INTRAVENOUS | Status: DC | PRN
  Administered 2020-06-19: 200 mg via INTRAVENOUS

## 2020-06-19 MED ORDER — CEFAZOLIN SODIUM-DEXTROSE 2-4 GM/100ML-% IV SOLN
INTRAVENOUS | Status: AC
  Filled 2020-06-19: qty 100

## 2020-06-19 MED ORDER — ROPIVACAINE HCL 5 MG/ML IJ SOLN
INTRAMUSCULAR | Status: DC | PRN
  Administered 2020-06-19: 50 mL via PERINEURAL

## 2020-06-19 MED ORDER — KETOROLAC TROMETHAMINE 30 MG/ML IJ SOLN
INTRAMUSCULAR | Status: AC
  Filled 2020-06-19: qty 1

## 2020-06-19 MED ORDER — SENNA 8.6 MG PO TABS: 2.0000 | ORAL_TABLET | Freq: Two times a day (BID) | ORAL | 0 refills | Status: DC

## 2020-06-19 MED ORDER — LACTATED RINGERS IV SOLN: INTRAVENOUS | Status: DC

## 2020-06-19 MED ORDER — SODIUM CHLORIDE 0.9 % IV SOLN: INTRAVENOUS | Status: DC

## 2020-06-19 SURGICAL SUPPLY — 76 items
APL PRP STRL LF DISP 70% ISPRP (MISCELLANEOUS) ×1
BANDAGE ESMARK 6X9 LF (GAUZE/BANDAGES/DRESSINGS) IMPLANT
BIT DRILL 2.5X2.75 QC CALB (BIT) ×2 IMPLANT
BLADE SURG 15 STRL LF DISP TIS (BLADE) ×2 IMPLANT
BLADE SURG 15 STRL SS (BLADE) ×9
BNDG CMPR 9X4 STRL LF SNTH (GAUZE/BANDAGES/DRESSINGS)
BNDG CMPR 9X6 STRL LF SNTH (GAUZE/BANDAGES/DRESSINGS)
BNDG COHESIVE 4X5 TAN STRL (GAUZE/BANDAGES/DRESSINGS) ×3 IMPLANT
BNDG COHESIVE 6X5 TAN STRL LF (GAUZE/BANDAGES/DRESSINGS) ×3 IMPLANT
BNDG ESMARK 4X9 LF (GAUZE/BANDAGES/DRESSINGS) IMPLANT
BNDG ESMARK 6X9 LF (GAUZE/BANDAGES/DRESSINGS)
CANISTER SUCT 1200ML W/VALVE (MISCELLANEOUS) ×3 IMPLANT
CHLORAPREP W/TINT 26 (MISCELLANEOUS) ×3 IMPLANT
COVER BACK TABLE 60X90IN (DRAPES) ×3 IMPLANT
COVER WAND RF STERILE (DRAPES) IMPLANT
CUFF TOURN SGL QUICK 34 (TOURNIQUET CUFF) ×3
CUFF TRNQT CYL 34X4.125X (TOURNIQUET CUFF) IMPLANT
DECANTER SPIKE VIAL GLASS SM (MISCELLANEOUS) IMPLANT
DRAPE EXTREMITY T 121X128X90 (DISPOSABLE) ×3 IMPLANT
DRAPE OEC MINIVIEW 54X84 (DRAPES) ×3 IMPLANT
DRAPE U-SHAPE 47X51 STRL (DRAPES) ×3 IMPLANT
DRSG MEPITEL 4X7.2 (GAUZE/BANDAGES/DRESSINGS) ×3 IMPLANT
DRSG PAD ABDOMINAL 8X10 ST (GAUZE/BANDAGES/DRESSINGS) ×6 IMPLANT
ELECT REM PT RETURN 9FT ADLT (ELECTROSURGICAL) ×3
ELECTRODE REM PT RTRN 9FT ADLT (ELECTROSURGICAL) ×1 IMPLANT
GAUZE SPONGE 4X4 12PLY STRL (GAUZE/BANDAGES/DRESSINGS) ×3 IMPLANT
GLOVE BIO SURGEON STRL SZ8 (GLOVE) ×3 IMPLANT
GLOVE BIOGEL PI IND STRL 7.0 (GLOVE) IMPLANT
GLOVE BIOGEL PI IND STRL 8 (GLOVE) ×2 IMPLANT
GLOVE BIOGEL PI INDICATOR 7.0 (GLOVE) ×4
GLOVE BIOGEL PI INDICATOR 8 (GLOVE) ×4
GLOVE ECLIPSE 8.0 STRL XLNG CF (GLOVE) ×3 IMPLANT
GLOVE SURG SS PI 7.0 STRL IVOR (GLOVE) ×2 IMPLANT
GOWN STRL REUS W/ TWL LRG LVL3 (GOWN DISPOSABLE) ×1 IMPLANT
GOWN STRL REUS W/ TWL XL LVL3 (GOWN DISPOSABLE) ×2 IMPLANT
GOWN STRL REUS W/TWL LRG LVL3 (GOWN DISPOSABLE) ×3
GOWN STRL REUS W/TWL XL LVL3 (GOWN DISPOSABLE) ×6
NEEDLE HYPO 22GX1.5 SAFETY (NEEDLE) ×2 IMPLANT
NS IRRIG 1000ML POUR BTL (IV SOLUTION) ×3 IMPLANT
PACK BASIN DAY SURGERY FS (CUSTOM PROCEDURE TRAY) ×3 IMPLANT
PAD CAST 4YDX4 CTTN HI CHSV (CAST SUPPLIES) ×1 IMPLANT
PADDING CAST ABS 4INX4YD NS (CAST SUPPLIES)
PADDING CAST ABS COTTON 4X4 ST (CAST SUPPLIES) IMPLANT
PADDING CAST COTTON 4X4 STRL (CAST SUPPLIES) ×3
PADDING CAST COTTON 6X4 STRL (CAST SUPPLIES) ×3 IMPLANT
PENCIL SMOKE EVACUATOR (MISCELLANEOUS) ×3 IMPLANT
PLATE ACE 100DEG 6HOLE (Plate) ×2 IMPLANT
SANITIZER HAND PURELL 535ML FO (MISCELLANEOUS) ×3 IMPLANT
SCREW CORTICAL 3.5MM  16MM (Screw) ×3 IMPLANT
SCREW CORTICAL 3.5MM 14MM (Screw) ×2 IMPLANT
SCREW CORTICAL 3.5MM 16MM (Screw) IMPLANT
SCREW CORTICAL 3.5MM 36MM (Screw) ×2 IMPLANT
SCREW CORTICAL 3.5MM 40MM (Screw) ×4 IMPLANT
SCREW CORTICAL 3.5MM 50MM (Screw) ×2 IMPLANT
SHEET MEDIUM DRAPE 40X70 STRL (DRAPES) ×3 IMPLANT
SLEEVE SCD COMPRESS KNEE MED (MISCELLANEOUS) ×3 IMPLANT
SPLINT FAST PLASTER 5X30 (CAST SUPPLIES) ×40
SPLINT PLASTER CAST FAST 5X30 (CAST SUPPLIES) ×20 IMPLANT
SPONGE LAP 18X18 RF (DISPOSABLE) ×3 IMPLANT
STOCKINETTE 6  STRL (DRAPES) ×3
STOCKINETTE 6 STRL (DRAPES) ×1 IMPLANT
SUCTION FRAZIER HANDLE 10FR (MISCELLANEOUS) ×3
SUCTION TUBE FRAZIER 10FR DISP (MISCELLANEOUS) ×1 IMPLANT
SUT ETHILON 3 0 PS 1 (SUTURE) ×3 IMPLANT
SUT FIBERWIRE #2 38 T-5 BLUE (SUTURE)
SUT MNCRL AB 3-0 PS2 18 (SUTURE) IMPLANT
SUT VIC AB 0 SH 27 (SUTURE) IMPLANT
SUT VIC AB 2-0 SH 27 (SUTURE) ×3
SUT VIC AB 2-0 SH 27XBRD (SUTURE) ×1 IMPLANT
SUTURE FIBERWR #2 38 T-5 BLUE (SUTURE) IMPLANT
SYR BULB EAR ULCER 3OZ GRN STR (SYRINGE) ×3 IMPLANT
SYR CONTROL 10ML LL (SYRINGE) ×2 IMPLANT
TOWEL GREEN STERILE FF (TOWEL DISPOSABLE) ×6 IMPLANT
TUBE CONNECTING 20'X1/4 (TUBING) ×1
TUBE CONNECTING 20X1/4 (TUBING) ×2 IMPLANT
UNDERPAD 30X36 HEAVY ABSORB (UNDERPADS AND DIAPERS) ×3 IMPLANT

## 2020-06-19 NOTE — Progress Notes (Signed)
Assisted Dr. Miller with right, ultrasound guided, popliteal, adductor canal block. Side rails up, monitors on throughout procedure. See vital signs in flow sheet. Tolerated Procedure well. 

## 2020-06-19 NOTE — H&P (Signed)
Chad Hernandez is an 49 y.o. male.   Chief Complaint:  Right ankle pain HPI: Chad Hernandez is a 49 year old male who underwent total ankle arthroplasty on July 29. Upon beginning weightbearing he developed medial ankle pain and swelling after a few weeks. He was seen in clinic this past week where radiographs revealed a medial malleolus fracture. He presents now for operative treatment of this displaced fracture.  Past Medical History:  Diagnosis Date  . Closed nondisp fracture of right medial malleolus with routine healing   . GERD (gastroesophageal reflux disease)     Past Surgical History:  Procedure Laterality Date  . EYE SURGERY    . HAND SURGERY    . TOTAL ANKLE ARTHROPLASTY Right 04/10/2020   Procedure: TOTAL ANKLE ARTHROPLASTY;  Surgeon: Toni Arthurs, MD;  Location: [facility];  Service: Orthopedics;  Laterality: Right;    Family History  Problem Relation Age of Onset  . Hyperlipidemia Mother   . Hypertension Mother   . Breast cancer Mother   . Hypertension Brother   . Diabetes Neg Hx   . Heart attack Neg Hx   . Sudden death Neg Hx   . Cancer Neg Hx   . COPD Neg Hx   . Early death Neg Hx   . Hearing loss Neg Hx   . Heart disease Neg Hx   . Kidney disease Neg Hx   . Stroke Neg Hx    Social History:  reports that he has been smoking cigars. He has never used smokeless tobacco. He reports current alcohol use of about 3.0 standard drinks of alcohol per week. He reports that he does not use drugs.  Allergies: No Known Allergies  Medications Prior to Admission  Medication Sig Dispense Refill  . Ibuprofen-Famotidine (DUEXIS) 800-26.6 MG TABS Take 1 tablet by mouth 3 (three) times daily. 90 tablet 1  . oxybutynin (DITROPAN XL) 5 MG 24 hr tablet Take 1 tablet (5 mg total) by mouth at bedtime. 30 tablet 2    Results for orders placed or performed during the hospital encounter of 06/17/20 (from the past 48 hour(s))  SARS CORONAVIRUS 2 (TAT 6-24 HRS)  Nasopharyngeal Nasopharyngeal Swab     Status: None   Collection Time: 06/17/20 11:03 AM   Specimen: Nasopharyngeal Swab  Result Value Ref Range   SARS Coronavirus 2 NEGATIVE NEGATIVE    Comment: (NOTE) SARS-CoV-2 target nucleic acids are NOT DETECTED.  The SARS-CoV-2 RNA is generally detectable in upper and lower respiratory specimens during the acute phase of infection. Negative results do not preclude SARS-CoV-2 infection, do not rule out co-infections with other pathogens, and should not be used as the sole basis for treatment or other patient management decisions. Negative results must be combined with clinical observations, patient history, and epidemiological information. The expected result is Negative.  Fact Sheet for Patients: HairSlick.no  Fact Sheet for Healthcare Providers: quierodirigir.com  This test is not yet approved or cleared by the [place] FDA and  has been authorized for detection and/or diagnosis of SARS-CoV-2 by FDA under an Emergency Use Authorization (EUA). This EUA will remain  in effect (meaning this test can be used) for the duration of the COVID-19 declaration under Se ction 564(b)(1) of the Act, 21 U.S.C. section 360bbb-3(b)(1), unless the authorization is terminated or revoked sooner.  Performed at [facility], [address], [place], [place] [address]    No results found.  Review of Systems no recent fever, chills, nausea, vomiting  or changes in his appetite  Blood pressure (!) 143/98, pulse 94, temperature 98.8 F (37.1 C), temperature source Oral, resp. rate 18, height 6\' 1"  (1.854 m), weight 108.3 kg, SpO2 100 %. Physical Exam  Well-nourished well-developed man in no apparent distress. Alert and oriented x4. Normal mood and affect. Gait is heel-to-toe reciprocal bilaterally with some antalgia to the right. The right ankle is swollen medially. Is tender to palpation over the  medial malleolus. No gross deformity is noted. Anterior surgical incision is healed.  X-rays: Three views weightbearing of the right ankle show a vertical oriented medial malleolus fracture with no evidence of tibial implant instability.   Assessment/Plan Right medial malleolus fracture --to the operating room today for open treatment with internal fixation.  The risks and benefits of the alternative treatment options have been discussed in detail.  The patient wishes to proceed with surgery and specifically understands risks of bleeding, infection, nerve damage, blood clots, need for additional surgery, amputation and death.   , MD 2020-06-24, 8:46 AM

## 2020-06-19 NOTE — Anesthesia Procedure Notes (Signed)
Anesthesia Regional Block: Popliteal block   Pre-Anesthetic Checklist: ,, timeout performed, Correct Patient, Correct Site, Correct Laterality, Correct Procedure, Correct Position, site marked, Risks and benefits discussed,  Surgical consent,  Pre-op evaluation,  At surgeon's request and post-op pain management  Laterality: Right  Prep: chloraprep       Needles:  Injection technique: Single-shot  Needle Type: Stimiplex     Needle Length: 9cm  Needle Gauge: 21     Additional Needles:   Procedures:,,,, ultrasound used (permanent image in chart),,,,  Narrative:  Start time: 06/19/2020 8:53 AM End time: 06/19/2020 8:58 AM Injection made incrementally with aspirations every 5 mL.  Performed by: Personally  Anesthesiologist: Lowella Curb, MD

## 2020-06-19 NOTE — Op Note (Signed)
06/19/2020  11:27 AM  PATIENT:  Chad Hernandez  50 y.o. male  PRE-OPERATIVE DIAGNOSIS:  displaced fracture of right medial malleolus  POST-OPERATIVE DIAGNOSIS:  same  Procedure(s): 1.  OPEN REDUCTION INTERNAL FIXATION (ORIF) Right medial malleolus fracture   2.  AP, mortise and lateral xrays of the right ankle  SURGEON:  Toni Arthurs, MD  ASSISTANT: Chad Martinez, PA-C  ANESTHESIA:   General, regional  EBL:  minimal   TOURNIQUET:   Total Tourniquet Time Documented: Thigh (Right) - 30 minutes Total: Thigh (Right) - 30 minutes  COMPLICATIONS:  None apparent  DISPOSITION:  Extubated, awake and stable to recovery.  INDICATION FOR PROCEDURE: Chad Hernandez is a 49 year old male who is almost 3 months status post right total ankle replacement.  He developed some soreness and swelling at the medial malleolus a few weeks after transitioning out of the cam boot.  He followed up in the office this past week where radiographs revealed a displaced medial malleolus fracture.  He presents now for operative treatment of this displaced and unstable ankle injury.  Radiographs do not show any evidence of instability of his tibial implant.  The risks and benefits of the alternative treatment options have been discussed in detail.  The patient wishes to proceed with surgery and specifically understands risks of bleeding, infection, nerve damage, blood clots, need for additional surgery, amputation and death.  PROCEDURE IN DETAIL:  After pre operative consent was obtained, and the correct operative site was identified, the patient was brought to the operating room and placed supine on the OR table.  Anesthesia was administered.  Pre-operative antibiotics were administered.  A surgical timeout was taken.  The right lower extremity was prepped and draped in standard sterile fashion with a tourniquet around the thigh.  The extremity was elevated and the tourniquet was inflated to 250 mmHg.  A longitudinal  incision was made over the medial malleolus.  Dissection was carried down through the subcutaneous tissues.  Periosteum was incised and elevated.  The fracture site was identified.  The fracture was reduced.  A 6 hole one third tubular plate was selected from the [employer] titanium small frag set.  It was contoured to fit the medial malleolus.  It was placed over the apex of the fracture site.  3 bicortical screws were inserted proximal to the fracture site.  These compressed the fracture site appropriately at the apex of the fracture.  Another bicortical screw was then placed from the third most distal hole in the plate across the fracture site into the tibia.  Care was taken to position all of the proximal screws away from the tibial implant.  The distal 2 holes in the plate were drilled and filled with unicortical screws.  AP, lateral and mortise radiographs confirmed appropriate reduction of the fracture in appropriate position and length of all hardware.  The wound was then irrigated copiously.  Vancomycin powder was sprinkled in the wound.  Subcutaneous tissues were approximated with 2-0 Vicryl.  Skin incision was closed with a running 3-0 nylon.  Half percent Marcaine with epinephrine was infiltrated into the subcutaneous tissues around the incision to supplement the preoperative regional block.Sterile dressings were applied followed by a well-padded short leg splint.  The tourniquet was released after application of the dressings.  The patient was then awakened from anesthesia and transported to the recovery room in stable condition.  FOLLOW UP PLAN: Nonweightbearing on the right lower extremity.  Follow-up in the office in 2 weeks  for suture removal.  Plan 6 weeks nonweightbearing immobilization and then transition into a cam boot.   RADIOGRAPHS: AP, lateral and mortise radiographs of the right ankle are obtained intraoperatively.  These show interval reduction and fixation of the medial  malleolus fracture.  Hardware is appropriately positioned and of the appropriate lengths.  No evidence of instability at the tibial implant.    Chad Martinez PA-C was present and scrubbed for the duration of the operative case. His assistance was essential in positioning the patient, prepping and draping, gaining and maintaining exposure, performing the operation, closing and dressing the wounds and applying the splint.

## 2020-06-19 NOTE — Anesthesia Procedure Notes (Signed)
Anesthesia Regional Block: Adductor canal block   Pre-Anesthetic Checklist: ,, timeout performed, Correct Patient, Correct Site, Correct Laterality, Correct Procedure, Correct Position, site marked, Risks and benefits discussed,  Surgical consent,  Pre-op evaluation,  At surgeon's request and post-op pain management  Laterality: Right  Prep: chloraprep       Needles:  Injection technique: Single-shot  Needle Type: Stimiplex     Needle Length: 9cm  Needle Gauge: 21     Additional Needles:   Procedures:,,,, ultrasound used (permanent image in chart),,,,  Narrative:  Start time: 06/19/2020 8:52 AM End time: 06/19/2020 8:57 AM Injection made incrementally with aspirations every 5 mL.  Performed by: Personally  Anesthesiologist: Lowella Curb, MD

## 2020-06-19 NOTE — Anesthesia Preprocedure Evaluation (Signed)
Anesthesia Evaluation  Patient identified by MRN, date of birth, ID band Patient awake    Reviewed: Allergy & Precautions, NPO status , Patient's Chart, lab work & pertinent test results  History of Anesthesia Complications Negative for: history of anesthetic complications  Airway Mallampati: II  TM Distance: >3 FB Neck ROM: Full    Dental  (+) Dental Advisory Given, Teeth Intact   Pulmonary neg shortness of breath, neg sleep apnea, neg COPD, neg recent URI, Current Smoker and Patient abstained from smoking.,    breath sounds clear to auscultation       Cardiovascular negative cardio ROS   Rhythm:Regular     Neuro/Psych negative neurological ROS  negative psych ROS   GI/Hepatic Neg liver ROS, GERD  ,  Endo/Other  negative endocrine ROS  Renal/GU negative Renal ROS     Musculoskeletal  (+) Arthritis ,   Abdominal (+) + obese,   Peds  Hematology negative hematology ROS (+)   Anesthesia Other Findings   Reproductive/Obstetrics                             Anesthesia Physical  Anesthesia Plan  ASA: II  Anesthesia Plan: General and Regional   Post-op Pain Management:  Regional for Post-op pain   Induction: Intravenous  PONV Risk Score and Plan: 1 and Ondansetron and Treatment may vary due to age or medical condition  Airway Management Planned: LMA  Additional Equipment: None  Intra-op Plan:   Post-operative Plan: Extubation in OR  Informed Consent: I have reviewed the patients History and Physical, chart, labs and discussed the procedure including the risks, benefits and alternatives for the proposed anesthesia with the patient or authorized representative who has indicated his/her understanding and acceptance.     Dental advisory given  Plan Discussed with: CRNA and Surgeon  Anesthesia Plan Comments:         Anesthesia Quick Evaluation

## 2020-06-19 NOTE — Discharge Instructions (Addendum)
Toni Arthurs, MD [facility]  Please read the following information regarding your care after surgery.  Medications  You only need a prescription for the narcotic pain medicine (ex. oxycodone, Percocet, Norco).  All of the other medicines listed below are available over the counter. X Duexis that you have at home, as prescribed, for the first 3 days after surgery. X acetominophen (Tylenol) 650 mg every 4-6 hours as you need for minor to moderate pain X oxycodone as prescribed for severe pain  Narcotic pain medicine (ex. oxycodone, Percocet, Vicodin) will cause constipation.  To prevent this problem, take the following medicines while you are taking any pain medicine. X docusate sodium (Colace) 100 mg twice a day X senna (Senokot) 2 tablets twice a day  X To help prevent blood clots, take a baby aspirin (81 mg) twice a day for six weeks after surgery.  You should also get up every hour while you are awake to move around.    Weight Bearing X Do not bear any weight on the operated leg or foot.  Cast / Splint / Dressing X Keep your splint, cast or dressing clean and dry.  Don't put anything (coat hanger, pencil, etc) down inside of it.  If it gets damp, use a hair dryer on the cool setting to dry it.  If it gets soaked, call the office to schedule an appointment for a cast change.   After your dressing, cast or splint is removed; you may shower, but do not soak or scrub the wound.  Allow the water to run over it, and then gently pat it dry.  Swelling It is normal for you to have swelling where you had surgery.  To reduce swelling and pain, keep your toes above your nose for at least 3 days after surgery.  It may be necessary to keep your foot or leg elevated for several weeks.  If it hurts, it should be elevated.  Follow Up Call my office at [phone] when you are discharged from the hospital or surgery center to schedule an appointment to be seen two weeks after surgery.  Call my office  at [phone] if you develop a fever >101.5 F, nausea, vomiting, bleeding from the surgical site or severe pain.       Post Anesthesia Home Care Instructions  Activity: Get plenty of rest for the remainder of the day. A responsible individual must stay with you for 24 hours following the procedure.  For the next 24 hours, DO NOT: -Drive a car -Advertising copywriter -Drink alcoholic beverages -Take any medication unless instructed by your physician -Make any legal decisions or sign important papers.  Meals: Start with liquid foods such as gelatin or soup. Progress to regular foods as tolerated. Avoid greasy, spicy, heavy foods. If nausea and/or vomiting occur, drink only clear liquids until the nausea and/or vomiting subsides. Call your physician if vomiting continues.  Special Instructions/Symptoms: Your throat may feel dry or sore from the anesthesia or the breathing tube placed in your throat during surgery. If this causes discomfort, gargle with warm salt water. The discomfort should disappear within 24 hours.      Regional Anesthesia Blocks  1. Numbness or the inability to move the "blocked" extremity may last from 3-48 hours after placement. The length of time depends on the medication injected and your individual response to the medication. If the numbness is not going away after 48 hours, call your surgeon.  2. The extremity that is blocked will need to  be protected until the numbness is gone and the  Strength has returned. Because you cannot feel it, you will need to take extra care to avoid injury. Because it may be weak, you may have difficulty moving it or using it. You may not know what position it is in without looking at it while the block is in effect.  3. For blocks in the legs and feet, returning to weight bearing and walking needs to be done carefully. You will need to wait until the numbness is entirely gone and the strength has returned. You should be able to move  your leg and foot normally before you try and bear weight or walk. You will need someone to be with you when you first try to ensure you do not fall and possibly risk injury.  4. Bruising and tenderness at the needle site are common side effects and will resolve in a few days.  5. Persistent numbness or new problems with movement should be communicated to the surgeon or the [facility] [phone] [facility] [phone]).

## 2020-06-19 NOTE — Anesthesia Procedure Notes (Signed)
Procedure Name: LMA Insertion Date/Time: 06/19/2020 10:27 AM Performed by: Pearson Grippe, CRNA Pre-anesthesia Checklist: Patient identified, Emergency Drugs available, Suction available and Patient being monitored Patient Re-evaluated:Patient Re-evaluated prior to induction Oxygen Delivery Method: Circle system utilized Preoxygenation: Pre-oxygenation with 100% oxygen Induction Type: IV induction Ventilation: Mask ventilation without difficulty LMA: LMA inserted LMA Size: 5.0 Number of attempts: 1 Airway Equipment and Method: Bite block Placement Confirmation: positive ETCO2 Tube secured with: Tape Dental Injury: Teeth and Oropharynx as per pre-operative assessment

## 2020-06-19 NOTE — Anesthesia Postprocedure Evaluation (Signed)
Anesthesia Post Note  Patient: Chad Hernandez  Procedure(s) Performed: Open Reduction Internal Fixation (ORIF) Right medial malleolus fracture (Right Ankle)     Patient location during evaluation: PACU Anesthesia Type: Regional Level of consciousness: awake and alert Pain management: pain level controlled Vital Signs Assessment: post-procedure vital signs reviewed and stable Respiratory status: spontaneous breathing, nonlabored ventilation and respiratory function stable Cardiovascular status: blood pressure returned to baseline and stable Postop Assessment: no apparent nausea or vomiting Anesthetic complications: no   No complications documented.  Last Vitals:  Vitals:   06/19/20 1200 06/19/20 1211  BP: 137/90 (!) 138/94  Pulse: 92 88  Resp: 13 12  Temp:    SpO2: 100% 100%    Last Pain:  Vitals:   06/19/20 1226  TempSrc:   PainSc: 0-No pain        RLE Motor Response: No movement due to regional block (06/19/20 1226) RLE Sensation: Decreased (regional block) (06/19/20 1226)      Lowella Curb

## 2020-06-19 NOTE — Transfer of Care (Signed)
Immediate Anesthesia Transfer of Care Note  Patient: Chad Hernandez  Procedure(s) Performed: OPEN REDUCTION INTERNAL FIXATION (ORIF) Right medial malleolus fracture (Right Ankle)  Patient Location: PACU  Anesthesia Type:GA combined with regional for post-op pain  Level of Consciousness: drowsy and patient cooperative  Airway & Oxygen Therapy: Patient Spontanous Breathing and Patient connected to face mask oxygen  Post-op Assessment: Report given to RN and Post -op Vital signs reviewed and stable  Post vital signs: Reviewed and stable  Last Vitals:  Vitals Value Taken Time  BP 123/76 06/19/20 1126  Temp 36 C 06/19/20 1126  Pulse 76 06/19/20 1129  Resp 11 06/19/20 1129  SpO2 100 % 06/19/20 1129  Vitals shown include unvalidated device data.  Last Pain:  Vitals:   06/19/20 0816  TempSrc: Oral  PainSc:          Complications: No complications documented.

## 2020-06-24 ENCOUNTER — Encounter (HOSPITAL_BASED_OUTPATIENT_CLINIC_OR_DEPARTMENT_OTHER): Payer: Self-pay | Admitting: Orthopedic Surgery

## 2020-06-27 ENCOUNTER — Other Ambulatory Visit: Payer: Self-pay | Admitting: Family Medicine

## 2020-06-27 DIAGNOSIS — M19071 Primary osteoarthritis, right ankle and foot: Secondary | ICD-10-CM

## 2020-07-08 ENCOUNTER — Encounter: Payer: BC Managed Care – PPO | Admitting: Gastroenterology

## 2020-08-04 ENCOUNTER — Telehealth: Payer: Self-pay | Admitting: Family Medicine

## 2020-08-04 NOTE — Telephone Encounter (Signed)
LOV w/ Dr. Jordan Likes 04/02/20-- Pt's pharmacy([facility] called to request we initiate Pre-auth for his Duexis refill :  Ibuprofen-Famotidine (DUEXIS) 800-26.6 MG TABS [[id number]]   Order Details Dose: 1 tablet Route: Oral Frequency: 3 times daily  Dispense Quantity: 90 tablet Refills: 1       Sig: Take 1 tablet by mouth 3 (three) times daily.        ( I don't see that Dr.Schmitz ordered a refill for pt ).  [facility] @  [id number] - [place], [place] - [address]  [address] [place] [address]  Phone:  [phone] Fax:  [phone]   --Hayley gave Covermymeds Key : [id number]  Forwarding message to med asst for review.  -glh

## 2020-09-18 DIAGNOSIS — M25571 Pain in right ankle and joints of right foot: Secondary | ICD-10-CM | POA: Diagnosis not present

## 2020-09-24 DIAGNOSIS — M25571 Pain in right ankle and joints of right foot: Secondary | ICD-10-CM | POA: Diagnosis not present

## 2020-10-01 DIAGNOSIS — M25571 Pain in right ankle and joints of right foot: Secondary | ICD-10-CM | POA: Diagnosis not present

## 2020-10-06 DIAGNOSIS — S8251XD Displaced fracture of medial malleolus of right tibia, subsequent encounter for closed fracture with routine healing: Secondary | ICD-10-CM | POA: Diagnosis not present

## 2020-11-03 DIAGNOSIS — S8251XD Displaced fracture of medial malleolus of right tibia, subsequent encounter for closed fracture with routine healing: Secondary | ICD-10-CM | POA: Diagnosis not present

## 2020-11-03 DIAGNOSIS — M13852 Other specified arthritis, left hip: Secondary | ICD-10-CM | POA: Diagnosis not present

## 2020-11-03 DIAGNOSIS — M25552 Pain in left hip: Secondary | ICD-10-CM | POA: Diagnosis not present

## 2020-11-26 DIAGNOSIS — M160 Bilateral primary osteoarthritis of hip: Secondary | ICD-10-CM | POA: Diagnosis not present

## 2020-12-01 IMAGING — DX DG ANKLE COMPLETE 3+V*R*
3 series · 3 of 3 positions shown · non-contrast
Comparison: 03/28/2013

CLINICAL DATA: Chronic right ankle pain.  No known injury.

EXAM:
RIGHT ANKLE - COMPLETE 3+ VIEW

[ankle ap]
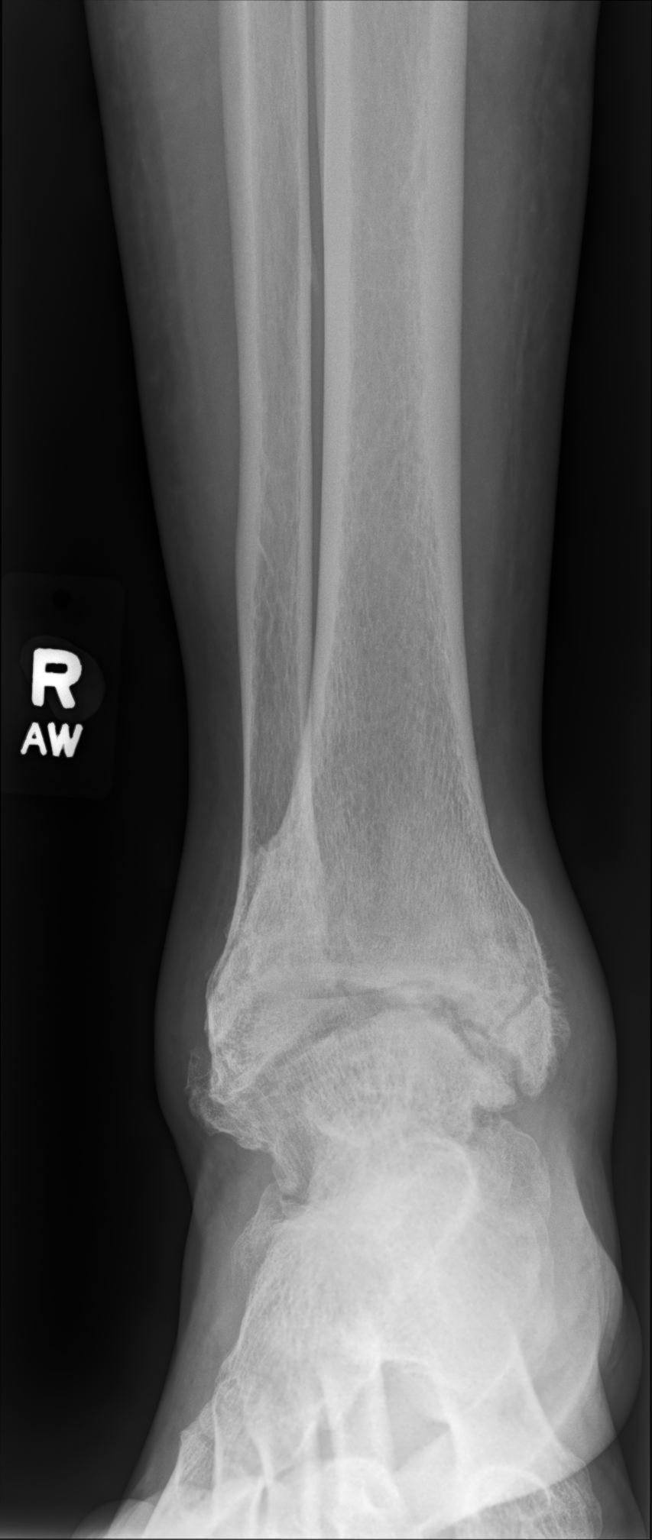

[ankle mlo]
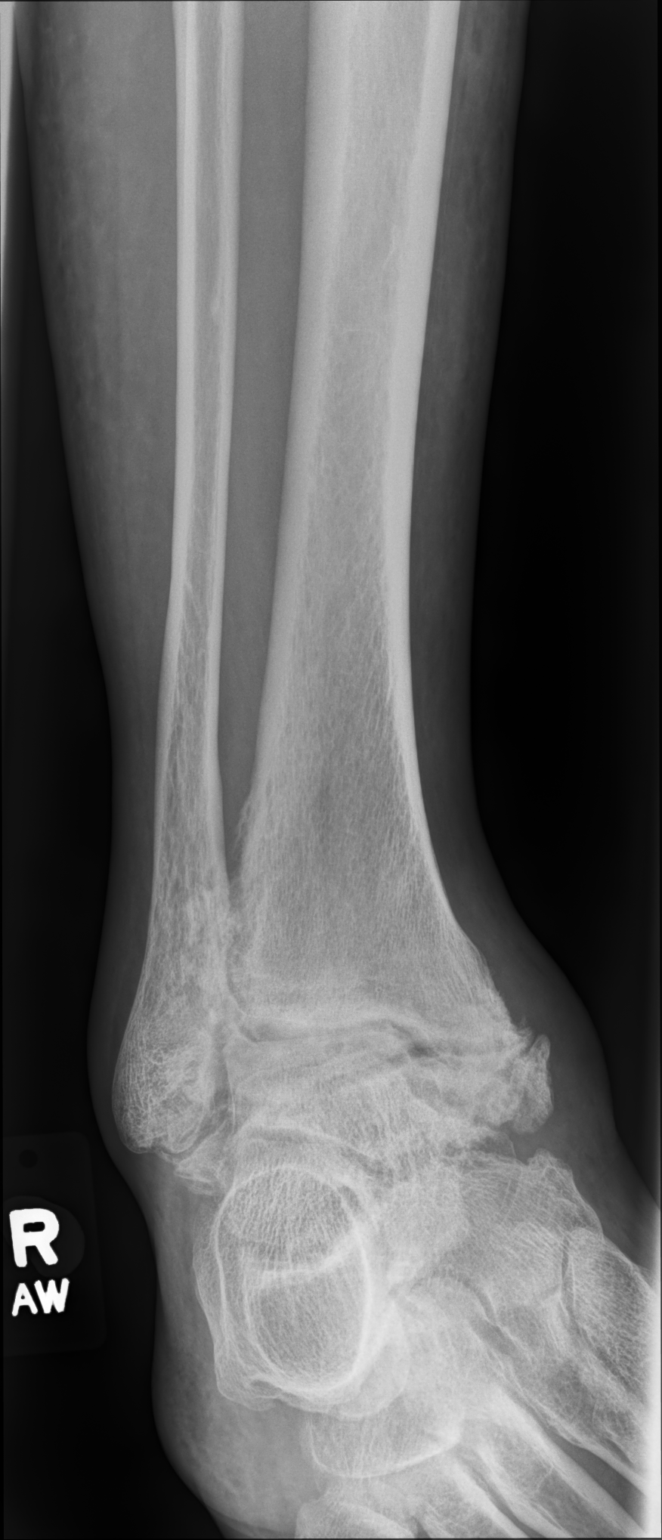

[ankle lat]
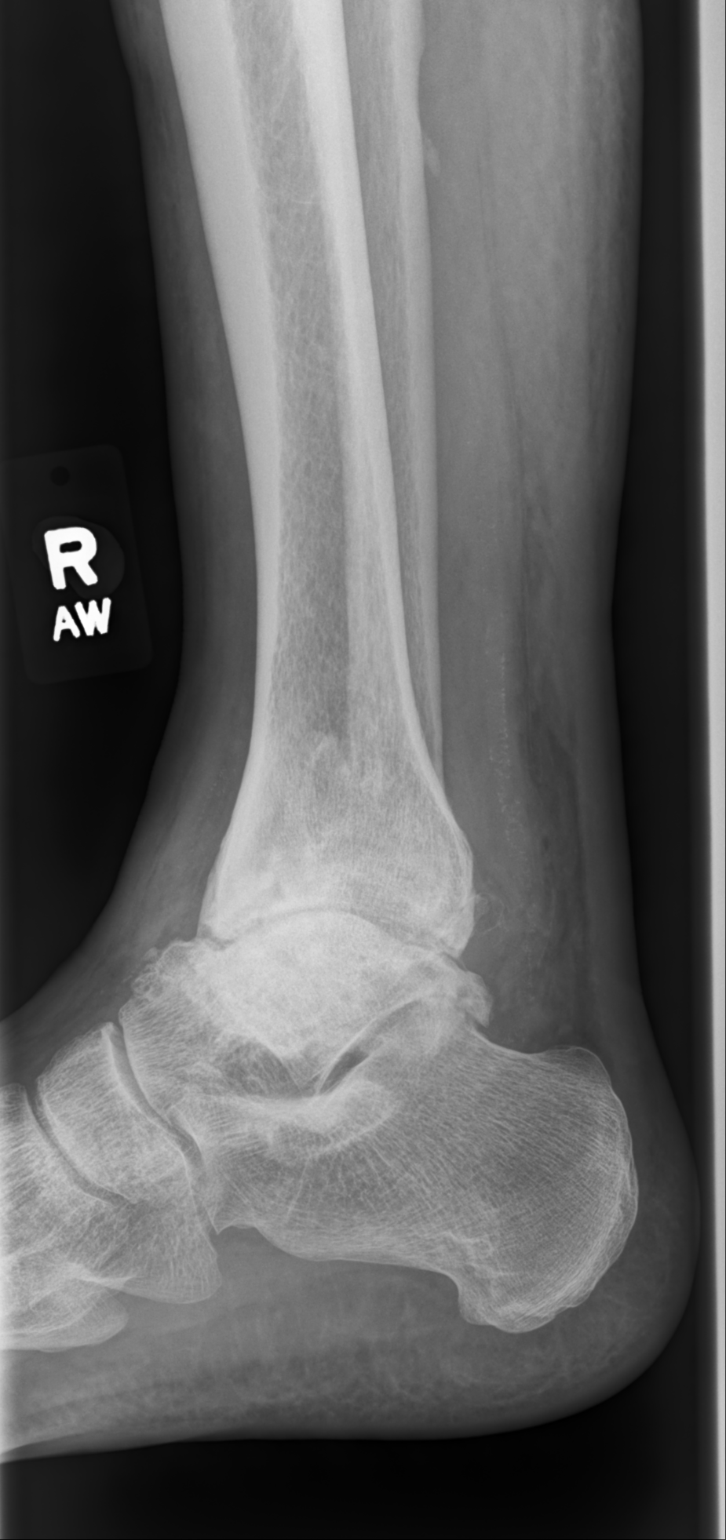

[3 of 3 positions shown; findings below may reference images not displayed]

FINDINGS: No definite acute fracture or dislocation. Obliquely oriented
lucency involving the medial malleolus may represent the sequela of
remote fracture. There is marked degenerative change of the
tibiotalar joint with joint space loss (most conspicuously involving
the medial aspect of the tibiotalar joint), subchondral sclerosis,
articular surface irregularity and osteophytosis.

Degenerative change and osteophytosis is also noted about
fibulotalar joint.

No plantar calcaneal spur.  Adjacent vascular calcifications.
IMPRESSION: 1. Marked deformity and degenerative change of the of the ankle
joint - differential considerations are broad though include the
sequela of posttraumatic, neuropathic joint, the sequela of prior
septic arthritis or erosive/inflammatory arthropathy. Clinical
correlation is advised.
2. Suspected old nondisplaced medial malleolar fracture.

## 2020-12-03 DIAGNOSIS — S8251XD Displaced fracture of medial malleolus of right tibia, subsequent encounter for closed fracture with routine healing: Secondary | ICD-10-CM | POA: Diagnosis not present

## 2020-12-03 DIAGNOSIS — M13852 Other specified arthritis, left hip: Secondary | ICD-10-CM | POA: Diagnosis not present

## 2020-12-11 ENCOUNTER — Telehealth: Payer: Self-pay

## 2020-12-11 NOTE — Telephone Encounter (Signed)
Received a pre-op evaluation form to be filled out.  Called his0 number and lft VM to rtn call to schedule an appt due to last OV was 04/29/20. Dm/cma

## 2021-01-12 ENCOUNTER — Other Ambulatory Visit: Payer: Self-pay

## 2021-01-12 ENCOUNTER — Ambulatory Visit: Payer: 59 | Admitting: Family Medicine

## 2021-01-12 ENCOUNTER — Encounter: Payer: Self-pay | Admitting: Family Medicine

## 2021-01-12 VITALS — BP 134/82 | HR 87 | Temp 97.0°F | Ht 73.0 in | Wt 235.4 lb

## 2021-01-12 DIAGNOSIS — E669 Obesity, unspecified: Secondary | ICD-10-CM | POA: Diagnosis not present

## 2021-01-12 DIAGNOSIS — Z0000 Encounter for general adult medical examination without abnormal findings: Secondary | ICD-10-CM

## 2021-01-12 DIAGNOSIS — M1612 Unilateral primary osteoarthritis, left hip: Secondary | ICD-10-CM

## 2021-01-12 DIAGNOSIS — Z01818 Encounter for other preprocedural examination: Secondary | ICD-10-CM

## 2021-01-12 NOTE — Progress Notes (Signed)
Established Patient Office Visit  Subjective:  Patient ID: Chad Hernandez, male    DOB: 25-Nov-1970  Age: 50 y.o. MRN: [id number]  CC:  Chief Complaint  Patient presents with  . Pre-op Exam    Patient here for pre-op exam, no concerns.     HPI Chad Hernandez presents for clearance for surgical replacement of his left hip.  Is been bothering him for some time and needs to be fixed.  Ankle is done well after placement.  He is told that his right hip needs replacing as well.  Urine flow has been good.  Denies decreased force of stream, dribbling or afterwords or nocturia.  Was unable to go for his colonoscopy after last visit.  Past Medical History:  Diagnosis Date  . Closed nondisp fracture of right medial malleolus with routine healing   . GERD (gastroesophageal reflux disease)     Past Surgical History:  Procedure Laterality Date  . EYE SURGERY    . HAND SURGERY    . ORIF ANKLE FRACTURE Right 06/19/2020   Procedure: Open Reduction Internal Fixation (ORIF) Right medial malleolus fracture;  Surgeon: Toni Arthurs, MD;  Location: [facility];  Service: Orthopedics;  Laterality: Right;  . 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 Hyperlipidemia Mother   . Hypertension Mother   . Breast cancer Mother   . Hypertension Brother   . Diabetes Neg Hx   . Heart attack Neg Hx   . Sudden death Neg Hx   . Cancer Neg Hx   . COPD Neg Hx   . Early death Neg Hx   . Hearing loss Neg Hx   . Heart disease Neg Hx   . Kidney disease Neg Hx   . Stroke Neg Hx     Social History   Socioeconomic History  . Marital status: Married    Spouse name: Not on file  . Number of children: Not on file  . Years of education: Not on file  . Highest education level: Not on file   Occupational History  . Not on file  Tobacco Use  . Smoking status: Current Some Day Smoker    Types: Cigars  . Smokeless tobacco: Never Used  . Tobacco comment: occassionally smokes cigars  Substance and Sexual Activity  . Alcohol use: Yes    Alcohol/week: 3.0 standard drinks    Types: 3 Cans of beer per week    Comment: occassional  . Drug use: No  . Sexual activity: Yes  Other Topics Concern  . Not on file  Social History Narrative  . Not on file   Social Determinants of Health   Financial Resource Strain: Not on file  Food Insecurity: Not on file  Transportation Needs: Not on file  Physical Activity: Not on file  Stress: Not on file  Social Connections: Not on file  Intimate Partner Violence: Not on file    Outpatient Medications Prior to Visit  Medication Sig Dispense Refill  . Ibuprofen-Famotidine (DUEXIS) 800-26.6 MG TABS Take 1 tablet by mouth 3 (three) times daily. 90 tablet 1  . aspirin EC 81 MG tablet Take 1 tablet (81 mg total) by mouth 2 (two) times daily. (Patient not taking: Reported on 01/12/2021) 84 tablet 0  . docusate sodium (COLACE) 100 MG  capsule Take 1 capsule (100 mg total) by mouth 2 (two) times daily. While taking narcotic pain medicine. (Patient not taking: Reported on 01/12/2021) 30 capsule 0  . oxybutynin (DITROPAN XL) 5 MG 24 hr tablet Take 1 tablet (5 mg total) by mouth at bedtime. (Patient not taking: Reported on 01/12/2021) 30 tablet 2  . senna (SENOKOT) 8.6 MG TABS tablet Take 2 tablets (17.2 mg total) by mouth 2 (two) times daily. (Patient not taking: Reported on 01/12/2021) 30 tablet 0   No facility-administered medications prior to visit.    No Known Allergies  ROS Review of Systems  Constitutional: Negative.   HENT: Negative.   Eyes: Negative for photophobia and visual disturbance.  Respiratory: Negative.   Cardiovascular: Negative.   Gastrointestinal: Negative.   Endocrine: Negative for polyphagia and polyuria.  Genitourinary:  Negative for difficulty urinating, frequency and urgency.  Musculoskeletal: Positive for arthralgias.  Skin: Negative.   Neurological: Negative for speech difficulty and weakness.  Psychiatric/Behavioral: Negative.       Objective:    Physical Exam Vitals and nursing note reviewed.  Constitutional:      General: He is not in acute distress.    Appearance: Normal appearance. He is not ill-appearing, toxic-appearing or diaphoretic.  HENT:     Head: Normocephalic and atraumatic.     Right Ear: Tympanic membrane, ear canal and external ear normal.     Left Ear: Tympanic membrane, ear canal and external ear normal.     Mouth/Throat:     Mouth: Mucous membranes are moist.     Pharynx: Oropharynx is clear. No oropharyngeal exudate or posterior oropharyngeal erythema.  Eyes:     General: No scleral icterus.       Right eye: No discharge.        Left eye: No discharge.     Extraocular Movements: Extraocular movements intact.     Conjunctiva/sclera: Conjunctivae normal.     Pupils: Pupils are equal, round, and reactive to light.  Neck:     Vascular: No carotid bruit.  Cardiovascular:     Rate and Rhythm: Normal rate and regular rhythm.     Pulses:          Carotid pulses are 2+ on the right side and 2+ on the left side. Pulmonary:     Effort: Pulmonary effort is normal.     Breath sounds: Normal breath sounds.  Abdominal:     General: Abdomen is flat. Bowel sounds are normal. There is no distension.     Palpations: Abdomen is soft. There is no mass.     Tenderness: There is no abdominal tenderness. There is no guarding or rebound.     Hernia: No hernia is present.  Musculoskeletal:     Cervical back: No rigidity or tenderness.     Right lower leg: No edema.     Left lower leg: No edema.  Lymphadenopathy:     Cervical: No cervical adenopathy.  Skin:    General: Skin is warm and dry.  Neurological:     Mental Status: He is alert and oriented to person, place, and time.   Psychiatric:        Mood and Affect: Mood normal.        Behavior: Behavior normal.     BP 134/82   Pulse 87   Temp (!) 97 F (36.1 C) (Temporal)   Ht 6\' 1"  (1.854 m)   Wt 235 lb 6.4 oz (106.8 kg)   SpO2 98%  BMI 31.06 kg/m  Wt Readings from Last 3 Encounters:  01/12/21 235 lb 6.4 oz (106.8 kg)  06/19/20 238 lb 12.1 oz (108.3 kg)  04/29/20 225 lb (102.1 kg)     Health Maintenance Due  Topic Date Due  . Hepatitis C Screening  Never done  . HIV Screening  Never done  . COLONOSCOPY (Pts 45-79yrs Insurance coverage will need to be confirmed)  Never done    There are no preventive care reminders to display for this patient.  Lab Results  Component Value Date   TSH 1.55 09/26/2018   Lab Results  Component Value Date   WBC 7.1 05/01/2020   HGB 14.4 05/01/2020   HCT 44.2 05/01/2020   MCV 95.2 05/01/2020   PLT 294.0 05/01/2020   Lab Results  Component Value Date   NA 138 05/01/2020   K 3.9 05/01/2020   CO2 25 05/01/2020   GLUCOSE 99 05/01/2020   BUN 10 05/01/2020   CREATININE 1.03 05/01/2020   BILITOT 0.5 05/01/2020   ALKPHOS 118 (H) 05/01/2020   AST 16 05/01/2020   ALT 27 05/01/2020   PROT 7.9 05/01/2020   ALBUMIN 4.3 05/01/2020   CALCIUM 9.6 05/01/2020   GFR 92.10 05/01/2020   Lab Results  Component Value Date   CHOL 149 05/01/2020   Lab Results  Component Value Date   HDL 37.80 (L) 05/01/2020   Lab Results  Component Value Date   LDLCALC 91 05/01/2020   Lab Results  Component Value Date   TRIG 105.0 05/01/2020   Lab Results  Component Value Date   CHOLHDL 4 05/01/2020   No results found for: HGBA1C    Assessment & Plan:   Problem List Items Addressed This Visit      Musculoskeletal and Integument   Osteoarthritis of left hip     Other   Healthcare maintenance - Primary   Relevant Orders   Comprehensive metabolic panel   CBC   Lipid panel   Urinalysis, Routine w reflex microscopic   Ambulatory referral to Gastroenterology    Obesity (BMI 30-39.9)      No orders of the defined types were placed in this encounter.   Follow-up: Return Try to lose some weight and return fasting for blood work after your hip is healed from surgery.[employer] for surgery.  Advised him to work on weight loss.  He was given information on calorie counting to lose weight as well as information on health maintenance and disease prevention.  He will return fasting for above ordered blood work after his hip is healed.  Mliss Sax, MD

## 2021-01-12 NOTE — Patient Instructions (Addendum)
Calorie Counting for Weight Loss Calories are units of energy. Your body needs a certain number of calories from food to keep going throughout the day. When you eat or drink more calories than your body needs, your body stores the extra calories mostly as fat. When you eat or drink fewer calories than your body needs, your body burns fat to get the energy it needs. Calorie counting means keeping track of how many calories you eat and drink each day. Calorie counting can be helpful if you need to lose weight. If you eat fewer calories than your body needs, you should lose weight. Ask your health care provider what a healthy weight is for you. For calorie counting to work, you will need to eat the right number of calories each day to lose a healthy amount of weight per week. A dietitian can help you figure out how many calories you need in a day and will suggest ways to reach your calorie goal.  A healthy amount of weight to lose each week is usually 1-2 lb (0.5-0.9 kg). This usually means that your daily calorie intake should be reduced by 500-750 calories.  Eating 1,200-1,500 calories a day can help most women lose weight.  Eating 1,500-1,800 calories a day can help most men lose weight. What do I need to know about calorie counting? Work with your health care provider or dietitian to determine how many calories you should get each day. To meet your daily calorie goal, you will need to:  Find out how many calories are in each food that you would like to eat. Try to do this before you eat.  Decide how much of the food you plan to eat.  Keep a food log. Do this by writing down what you ate and how many calories it had. To successfully lose weight, it is important to balance calorie counting with a healthy lifestyle that includes regular activity. Where do I find calorie information? The number of calories in a food can be found on a Nutrition Facts label. If a food does not have a Nutrition Facts  label, try to look up the calories online or ask your dietitian for help. Remember that calories are listed per serving. If you choose to have more than one serving of a food, you will have to multiply the calories per serving by the number of servings you plan to eat. For example, the label on a package of bread might say that a serving size is 1 slice and that there are 90 calories in a serving. If you eat 1 slice, you will have eaten 90 calories. If you eat 2 slices, you will have eaten 180 calories.   How do I keep a food log? After each time that you eat, record the following in your food log as soon as possible:  What you ate. Be sure to include toppings, sauces, and other extras on the food.  How much you ate. This can be measured in cups, ounces, or number of items.  How many calories were in each food and drink.  The total number of calories in the food you ate. Keep your food log near you, such as in a pocket-sized notebook or on an app or website on your mobile phone. Some programs will calculate calories for you and show you how many calories you have left to meet your daily goal. What are some portion-control tips?  Know how many calories are in a serving. This will   help you know how many servings you can have of a certain food.  Use a measuring cup to measure serving sizes. You could also try weighing out portions on a kitchen scale. With time, you will be able to estimate serving sizes for some foods.  Take time to put servings of different foods on your favorite plates or in your favorite bowls and cups so you know what a serving looks like.  Try not to eat straight from a food's packaging, such as from a bag or box. Eating straight from the package makes it hard to see how much you are eating and can lead to overeating. Put the amount you would like to eat in a cup or on a plate to make sure you are eating the right portion.  Use smaller plates, glasses, and bowls for smaller  portions and to prevent overeating.  Try not to multitask. For example, avoid watching TV or using your computer while eating. If it is time to eat, sit down at a table and enjoy your food. This will help you recognize when you are full. It will also help you be more mindful of what and how much you are eating. What are tips for following this plan? Reading food labels  Check the calorie count compared with the serving size. The serving size may be smaller than what you are used to eating.  Check the source of the calories. Try to choose foods that are high in protein, fiber, and vitamins, and low in saturated fat, trans fat, and sodium. Shopping  Read nutrition labels while you shop. This will help you make healthy decisions about which foods to buy.  Pay attention to nutrition labels for low-fat or fat-free foods. These foods sometimes have the same number of calories or more calories than the full-fat versions. They also often have added sugar, starch, or salt to make up for flavor that was removed with the fat.  Make a grocery list of lower-calorie foods and stick to it. Cooking  Try to cook your favorite foods in a healthier way. For example, try baking instead of frying.  Use low-fat dairy products. Meal planning  Use more fruits and vegetables. One-half of your plate should be fruits and vegetables.  Include lean proteins, such as chicken, turkey, and fish. Lifestyle Each week, aim to do one of the following:  150 minutes of moderate exercise, such as walking.  75 minutes of vigorous exercise, such as running. General information  Know how many calories are in the foods you eat most often. This will help you calculate calorie counts faster.  Find a way of tracking calories that works for you. Get creative. Try different apps or programs if writing down calories does not work for you. What foods should I eat?  Eat nutritious foods. It is better to have a nutritious,  high-calorie food, such as an avocado, than a food with few nutrients, such as a bag of potato chips.  Use your calories on foods and drinks that will fill you up and will not leave you hungry soon after eating. ? Examples of foods that fill you up are nuts and nut butters, vegetables, lean proteins, and high-fiber foods such as whole grains. High-fiber foods are foods with more than 5 g of fiber per serving.  Pay attention to calories in drinks. Low-calorie drinks include water and unsweetened drinks. The items listed above may not be a complete list of foods and beverages you can eat.   Contact a dietitian for more information.   What foods should I limit? Limit foods or drinks that are not good sources of vitamins, minerals, or protein or that are high in unhealthy fats. These include:  Candy.  Other sweets.  Sodas, specialty coffee drinks, alcohol, and juice. The items listed above may not be a complete list of foods and beverages you should avoid. Contact a dietitian for more information. How do I count calories when eating out?  Pay attention to portions. Often, portions are much larger when eating out. Try these tips to keep portions smaller: ? Consider sharing a meal instead of getting your own. ? If you get your own meal, eat only half of it. Before you start eating, ask for a container and put half of your meal into it. ? When available, consider ordering smaller portions from the menu instead of full portions.  Pay attention to your food and drink choices. Knowing the way food is cooked and what is included with the meal can help you eat fewer calories. ? If calories are listed on the menu, choose the lower-calorie options. ? Choose dishes that include vegetables, fruits, whole grains, low-fat dairy products, and lean proteins. ? Choose items that are boiled, broiled, grilled, or steamed. Avoid items that are buttered, battered, fried, or served with cream sauce. Items labeled as  crispy are usually fried, unless stated otherwise. ? Choose water, low-fat milk, unsweetened iced tea, or other drinks without added sugar. If you want an alcoholic beverage, choose a lower-calorie option, such as a glass of wine or light beer. ? Ask for dressings, sauces, and syrups on the side. These are usually high in calories, so you should limit the amount you eat. ? If you want a salad, choose a garden salad and ask for grilled meats. Avoid extra toppings such as bacon, cheese, or fried items. Ask for the dressing on the side, or ask for olive oil and vinegar or lemon to use as dressing.  Estimate how many servings of a food you are given. Knowing serving sizes will help you be aware of how much food you are eating at restaurants. Where to find more information  Centers for Disease Control and Prevention: FootballExhibition.com.br  [place] Department of Agriculture: WrestlingReporter.dk Summary  Calorie counting means keeping track of how many calories you eat and drink each day. If you eat fewer calories than your body needs, you should lose weight.  A healthy amount of weight to lose per week is usually 1-2 lb (0.5-0.9 kg). This usually means reducing your daily calorie intake by 500-750 calories.  The number of calories in a food can be found on a Nutrition Facts label. If a food does not have a Nutrition Facts label, try to look up the calories online or ask your dietitian for help.  Use smaller plates, glasses, and bowls for smaller portions and to prevent overeating.  Use your calories on foods and drinks that will fill you up and not leave you hungry shortly after a meal. This information is not intended to replace advice given to you by your health care provider. Make sure you discuss any questions you have with your health care provider. Document Revised: 10/11/2019 Document Reviewed: 10/11/2019 Elsevier Patient Education  2021 [employer] Inc.  Health Maintenance, Male Adopting a healthy lifestyle  and getting preventive care are important in promoting health and wellness. Ask your health care provider about:  The right schedule for you to have regular tests and  exams.  Things you can do on your own to prevent diseases and keep yourself healthy. What should I know about diet, weight, and exercise? Eat a healthy diet  Eat a diet that includes plenty of vegetables, fruits, low-fat dairy products, and lean protein.  Do not eat a lot of foods that are high in solid fats, added sugars, or sodium.   Maintain a healthy weight Body mass index (BMI) is a measurement that can be used to identify possible weight problems. It estimates body fat based on height and weight. Your health care provider can help determine your BMI and help you achieve or maintain a healthy weight. Get regular exercise Get regular exercise. This is one of the most important things you can do for your health. Most adults should:  Exercise for at least 150 minutes each week. The exercise should increase your heart rate and make you sweat (moderate-intensity exercise).  Do strengthening exercises at least twice a week. This is in addition to the moderate-intensity exercise.  Spend less time sitting. Even light physical activity can be beneficial. Watch cholesterol and blood lipids Have your blood tested for lipids and cholesterol at 52 years of age, then have this test every 5 years. You may need to have your cholesterol levels checked more often if:  Your lipid or cholesterol levels are high.  You are older than 52 years of age.  You are at high risk for heart disease. What should I know about cancer screening? Many types of cancers can be detected early and may often be prevented. Depending on your health history and family history, you may need to have cancer screening at various ages. This may include screening for:  Colorectal cancer.  Prostate cancer.  Skin cancer.  Lung cancer. What should I know about  heart disease, diabetes, and high blood pressure? Blood pressure and heart disease  High blood pressure causes heart disease and increases the risk of stroke. This is more likely to develop in people who have high blood pressure readings, are of African descent, or are overweight.  Talk with your health care provider about your target blood pressure readings.  Have your blood pressure checked: ? Every 3-5 years if you are 43-42 years of age. ? Every year if you are 77 years old or older.  If you are between the ages of 63 and 55 and are a current or former smoker, ask your health care provider if you should have a one-time screening for abdominal aortic aneurysm (AAA). Diabetes Have regular diabetes screenings. This checks your fasting blood sugar level. Have the screening done:  Once every three years after age 19 if you are at a normal weight and have a low risk for diabetes.  More often and at a younger age if you are overweight or have a high risk for diabetes. What should I know about preventing infection? Hepatitis B If you have a higher risk for hepatitis B, you should be screened for this virus. Talk with your health care provider to find out if you are at risk for hepatitis B infection. Hepatitis C Blood testing is recommended for:  Everyone born from [birth year] through 1967.  Anyone with known risk factors for hepatitis C. Sexually transmitted infections (STIs)  You should be screened each year for STIs, including gonorrhea and chlamydia, if: ? You are sexually active and are younger than 52 years of age. ? You are older than 52 years of age and your health care provider  tells you that you are at risk for this type of infection. ? Your sexual activity has changed since you were last screened, and you are at increased risk for chlamydia or gonorrhea. Ask your health care provider if you are at risk.  Ask your health care provider about whether you are at high risk for HIV. Your  health care provider may recommend a prescription medicine to help prevent HIV infection. If you choose to take medicine to prevent HIV, you should first get tested for HIV. You should then be tested every 3 months for as long as you are taking the medicine. Follow these instructions at home: Lifestyle  Do not use any products that contain nicotine or tobacco, such as cigarettes, e-cigarettes, and chewing tobacco. If you need help quitting, ask your health care provider.  Do not use street drugs.  Do not share needles.  Ask your health care provider for help if you need support or information about quitting drugs. Alcohol use  Do not drink alcohol if your health care provider tells you not to drink.  If you drink alcohol: ? Limit how much you have to 0-2 drinks a day. ? Be aware of how much alcohol is in your drink. In the [place] one drink equals one 12 oz bottle of beer (355 mL), one 5 oz glass of wine (148 mL), or one 1 oz glass of hard liquor (44 mL). General instructions  Schedule regular health, dental, and eye exams.  Stay current with your vaccines.  Tell your health care provider if: ? You often feel depressed. ? You have ever been abused or do not feel safe at home. Summary  Adopting a healthy lifestyle and getting preventive care are important in promoting health and wellness.  Follow your health care provider's instructions about healthy diet, exercising, and getting tested or screened for diseases.  Follow your health care provider's instructions on monitoring your cholesterol and blood pressure. This information is not intended to replace advice given to you by your health care provider. Make sure you discuss any questions you have with your health care provider. Document Revised: 08/23/2018 Document Reviewed: 08/23/2018 Elsevier Patient Education  2021 Elsevier Inc.  Preventive Care 79-10 Years Old, Male Preventive care refers to lifestyle choices and visits with  your health care provider that can promote health and wellness. This includes:  A yearly physical exam. This is also called an annual wellness visit.  Regular dental and eye exams.  Immunizations.  Screening for certain conditions.  Healthy lifestyle choices, such as: ? Eating a healthy diet. ? Getting regular exercise. ? Not using drugs or products that contain nicotine and tobacco. ? Limiting alcohol use. What can I expect for my preventive care visit? Physical exam Your health care provider will check your:  Height and weight. These may be used to calculate your BMI (body mass index). BMI is a measurement that tells if you are at a healthy weight.  Heart rate and blood pressure.  Body temperature.  Skin for abnormal spots. Counseling Your health care provider may ask you questions about your:  Past medical problems.  Family's medical history.  Alcohol, tobacco, and drug use.  Emotional well-being.  Home life and relationship well-being.  Sexual activity.  Diet, exercise, and sleep habits.  Work and work Astronomer.  Access to firearms. What immunizations do I need? Vaccines are usually given at various ages, according to a schedule. Your health care provider will recommend vaccines for you based on  your age, medical history, and lifestyle or other factors, such as travel or where you work.   What tests do I need? Blood tests  Lipid and cholesterol levels. These may be checked every 5 years, or more often if you are over 64 years old.  Hepatitis C test.  Hepatitis B test. Screening  Lung cancer screening. You may have this screening every year starting at age 17 if you have a 30-pack-year history of smoking and currently smoke or have quit within the past 15 years.  Prostate cancer screening. Recommendations will vary depending on your family history and other risks.  Genital exam to check for testicular cancer or hernias.  Colorectal cancer  screening. ? All adults should have this screening starting at age 71 and continuing until age 24. ? Your health care provider may recommend screening at age 62 if you are at increased risk. ? You will have tests every 1-10 years, depending on your results and the type of screening test.  Diabetes screening. ? This is done by checking your blood sugar (glucose) after you have not eaten for a while (fasting). ? You may have this done every 1-3 years.  STD (sexually transmitted disease) testing, if you are at risk. Follow these instructions at home: Eating and drinking  Eat a diet that includes fresh fruits and vegetables, whole grains, lean protein, and low-fat dairy products.  Take vitamin and mineral supplements as recommended by your health care provider.  Do not drink alcohol if your health care provider tells you not to drink.  If you drink alcohol: ? Limit how much you have to 0-2 drinks a day. ? Be aware of how much alcohol is in your drink. In the [place] one drink equals one 12 oz bottle of beer (355 mL), one 5 oz glass of wine (148 mL), or one 1 oz glass of hard liquor (44 mL).   Lifestyle  Take daily care of your teeth and gums. Brush your teeth every morning and night with fluoride toothpaste. Floss one time each day.  Stay active. Exercise for at least 30 minutes 5 or more days each week.  Do not use any products that contain nicotine or tobacco, such as cigarettes, e-cigarettes, and chewing tobacco. If you need help quitting, ask your health care provider.  Do not use drugs.  If you are sexually active, practice safe sex. Use a condom or other form of protection to prevent STIs (sexually transmitted infections).  If told by your health care provider, take low-dose aspirin daily starting at age 30.  Find healthy ways to cope with stress, such as: ? Meditation, yoga, or listening to music. ? Journaling. ? Talking to a trusted person. ? Spending time with friends and  family. Safety  Always wear your seat belt while driving or riding in a vehicle.  Do not drive: ? If you have been drinking alcohol. Do not ride with someone who has been drinking. ? When you are tired or distracted. ? While texting.  Wear a helmet and other protective equipment during sports activities.  If you have firearms in your house, make sure you follow all gun safety procedures. What's next?  Go to your health care provider once a year for an annual wellness visit.  Ask your health care provider how often you should have your eyes and teeth checked.  Stay up to date on all vaccines. This information is not intended to replace advice given to you by your health care  provider. Make sure you discuss any questions you have with your health care provider. Document Revised: 05/29/2019 Document Reviewed: 08/24/2018 Elsevier Patient Education  2021 [employer]

## 2021-01-14 ENCOUNTER — Other Ambulatory Visit: Payer: Self-pay

## 2021-01-14 ENCOUNTER — Other Ambulatory Visit (INDEPENDENT_AMBULATORY_CARE_PROVIDER_SITE_OTHER): Payer: 59

## 2021-01-14 DIAGNOSIS — Z0000 Encounter for general adult medical examination without abnormal findings: Secondary | ICD-10-CM

## 2021-01-14 LAB — LIPID PANEL
Cholesterol: 160 mg/dL (ref 0–200)
HDL: 29.6 mg/dL — ABNORMAL LOW (ref >39.00)
LDL Cholesterol: 111 mg/dL — ABNORMAL HIGH (ref 0–99)
NonHDL: 130.7
Total CHOL/HDL Ratio: 5
Triglycerides: 101 mg/dL (ref 0.0–149.0)
VLDL: 20.2 mg/dL (ref 0.0–40.0)

## 2021-01-14 LAB — URINALYSIS, ROUTINE W REFLEX MICROSCOPIC
Bilirubin Urine: NEGATIVE
Hgb urine dipstick: NEGATIVE
Ketones, ur: NEGATIVE
Leukocytes,Ua: NEGATIVE
Nitrite: NEGATIVE
RBC / HPF: NONE SEEN (ref 0–?)
Specific Gravity, Urine: >=1.03 — AB (ref 1.000–1.030)
Total Protein, Urine: NEGATIVE
Urine Glucose: NEGATIVE
Urobilinogen, UA: 1 (ref 0.0–1.0)
pH: 5.5 (ref 5.0–8.0)

## 2021-01-14 LAB — CBC
HCT: 42.9 % (ref 39.0–52.0)
Hemoglobin: 14.7 g/dL (ref 13.0–17.0)
MCHC: 34.2 g/dL (ref 30.0–36.0)
MCV: 90.5 fl (ref 78.0–100.0)
Platelets: 273 10*3/uL (ref 150.0–400.0)
RBC: 4.74 Mil/uL (ref 4.22–5.81)
RDW: 14 % (ref 11.5–15.5)
WBC: 7.3 10*3/uL (ref 4.0–10.5)

## 2021-01-14 LAB — COMPREHENSIVE METABOLIC PANEL
ALT: 48 U/L (ref 0–53)
AST: 46 U/L — ABNORMAL HIGH (ref 0–37)
Albumin: 4.3 g/dL (ref 3.5–5.2)
Alkaline Phosphatase: 121 U/L — ABNORMAL HIGH (ref 39–117)
BUN: 15 mg/dL (ref 6–23)
CO2: 25 mEq/L (ref 19–32)
Calcium: 9.3 mg/dL (ref 8.4–10.5)
Chloride: 106 mEq/L (ref 96–112)
Creatinine, Ser: 1 mg/dL (ref 0.40–1.50)
GFR: 86.98 mL/min (ref >60.00)
Glucose, Bld: 98 mg/dL (ref 70–99)
Potassium: 3.9 mEq/L (ref 3.5–5.1)
Sodium: 138 mEq/L (ref 135–145)
Total Bilirubin: 0.5 mg/dL (ref 0.2–1.2)
Total Protein: 8 g/dL (ref 6.0–8.3)

## 2021-01-14 NOTE — Progress Notes (Signed)
Per orders of Dr. Kremer pt is here for labs, pt tolerated draw well.  

## 2021-01-23 NOTE — Progress Notes (Signed)
DUE TO COVID-19 ONLY ONE VISITOR IS ALLOWED TO COME WITH YOU AND STAY IN THE WAITING ROOM ONLY DURING PRE OP AND PROCEDURE DAY OF SURGERY. THE 1 VISITOR  MAY VISIT WITH YOU AFTER SURGERY IN YOUR PRIVATE ROOM DURING VISITING HOURS ONLY!  YOU NEED TO HAVE A COVID 19 TEST ON_______ @_______ , THIS TEST MUST BE DONE BEFORE SURGERY,  COVID TESTING SITE [address] [place] , IT IS ON THE RIGHT GOING OUT [address] APPROXIMATELY  2 MINUTES PAST [employer] ON THE RIGHT. ONCE YOUR COVID TEST IS COMPLETED,  PLEASE BEGIN THE QUARANTINE INSTRUCTIONS AS OUTLINED IN YOUR HANDOUT.                Chad Hernandez  01/23/2021   Your procedure is scheduled on:               02/03/2021   Report to [facility] Main  Entrance   Report to admitting at     0900 AM     Call this number if you have problems the morning of surgery [phone]    REMEMBER: NO  SOLID FOOD CANDY OR GUM AFTER MIDNIGHT. CLEAR LIQUIDS UNTIL 0830am         . NOTHING BY MOUTH EXCEPT CLEAR LIQUIDS UNTIL     0830am   . PLEASE FINISH ENSURE DRINK PER SURGEON ORDER  WHICH NEEDS TO BE COMPLETED AT 0830am      .      CLEAR LIQUID DIET   Foods Allowed                                                                    Coffee and tea, regular and decaf                            Fruit ices (not with fruit pulp)                                      Iced Popsicles                                    Carbonated beverages, regular and diet                                    Cranberry, grape and apple juices Sports drinks like Gatorade Lightly seasoned clear broth or consume(fat free) Sugar, honey syrup ___________________________________________________________________      BRUSH YOUR TEETH MORNING OF SURGERY AND RINSE YOUR MOUTH OUT, NO CHEWING GUM CANDY OR MINTS.     Take these medicines the morning of surgery with A SIP OF WATER:              None   DO NOT TAKE ANY DIABETIC MEDICATIONS  DAY OF YOUR SURGERY  You may not have any metal on your body including hair pins and              piercings  Do not wear jewelry, make-up, lotions, powders or perfumes, deodorant             Do not wear nail polish on your fingernails.  Do not shave  48 hours prior to surgery.              Men may shave face and neck.   Do not bring valuables to the hospital. [place].  Contacts, dentures or bridgework may not be worn into surgery.  Leave suitcase in the car. After surgery it may be brought to your room.     Patients discharged the day of surgery will not be allowed to drive home. IF YOU ARE HAVING SURGERY AND GOING HOME THE SAME DAY, YOU MUST HAVE AN ADULT TO DRIVE YOU HOME AND BE WITH YOU FOR 24 HOURS. YOU MAY GO HOME BY TAXI OR UBER OR ORTHERWISE, BUT AN ADULT MUST ACCOMPANY YOU HOME AND STAY WITH YOU FOR 24 HOURS.  Name and phone number of your driver:  Special Instructions: N/A              Please read over the following fact sheets you were given: _____________________________________________________________________  [facility] - Preparing for Surgery Before surgery, you can play an important role.  Because skin is not sterile, your skin needs to be as free of germs as possible.  You can reduce the number of germs on your skin by washing with CHG (chlorahexidine gluconate) soap before surgery.  CHG is an antiseptic cleaner which kills germs and bonds with the skin to continue killing germs even after washing. Please DO NOT use if you have an allergy to CHG or antibacterial soaps.  If your skin becomes reddened/irritated stop using the CHG and inform your nurse when you arrive at Short Stay. Do not shave (including legs and underarms) for at least 48 hours prior to the first CHG shower.  You may shave your face/neck. Please follow these instructions carefully:  1.  Shower with CHG Soap the night before  surgery and the  morning of Surgery.  2.  If you choose to wash your hair, wash your hair first as usual with your  normal  shampoo.  3.  After you shampoo, rinse your hair and body thoroughly to remove the  shampoo.                           4.  Use CHG as you would any other liquid soap.  You can apply chg directly  to the skin and wash                       Gently with a scrungie or clean washcloth.  5.  Apply the CHG Soap to your body ONLY FROM THE NECK DOWN.   Do not use on face/ open                           Wound or open sores. Avoid contact with eyes, ears mouth and genitals (private parts).  Wash face,  Genitals (private parts) with your normal soap.             6.  Wash thoroughly, paying special attention to the area where your surgery  will be performed.  7.  Thoroughly rinse your body with warm water from the neck down.  8.  DO NOT shower/wash with your normal soap after using and rinsing off  the CHG Soap.                9.  Pat yourself dry with a clean towel.            10.  Wear clean pajamas.            11.  Place clean sheets on your bed the night of your first shower and do not  sleep with pets. Day of Surgery : Do not apply any lotions/deodorants the morning of surgery.  Please wear clean clothes to the hospital/surgery center.  FAILURE TO FOLLOW THESE INSTRUCTIONS MAY RESULT IN THE CANCELLATION OF YOUR SURGERY PATIENT SIGNATURE_________________________________  NURSE SIGNATURE__________________________________  ________________________________________________________________________

## 2021-01-27 ENCOUNTER — Encounter (HOSPITAL_COMMUNITY)
Admission: RE | Admit: 2021-01-27 | Discharge: 2021-01-27 | Disposition: A | Discharge status: Home / Self Care | Payer: 59 | Source: Ambulatory Visit | Attending: Orthopedic Surgery | Admitting: Orthopedic Surgery

## 2021-01-27 ENCOUNTER — Other Ambulatory Visit: Payer: Self-pay

## 2021-01-27 ENCOUNTER — Encounter (HOSPITAL_COMMUNITY): Payer: Self-pay | Admitting: Orthopedic Surgery

## 2021-01-27 DIAGNOSIS — Z01812 Encounter for preprocedural laboratory examination: Secondary | ICD-10-CM | POA: Insufficient documentation

## 2021-01-27 LAB — COMPREHENSIVE METABOLIC PANEL
ALT: 58 U/L — ABNORMAL HIGH (ref 0–44)
AST: 53 U/L — ABNORMAL HIGH (ref 15–41)
Albumin: 4.7 g/dL (ref 3.5–5.0)
Alkaline Phosphatase: 113 U/L (ref 38–126)
Anion gap: 6 (ref 5–15)
BUN: 17 mg/dL (ref 6–20)
CO2: 23 mmol/L (ref 22–32)
Calcium: 9.3 mg/dL (ref 8.9–10.3)
Chloride: 109 mmol/L (ref 98–111)
Creatinine, Ser: 0.94 mg/dL (ref 0.61–1.24)
GFR, Estimated: >60 mL/min (ref >60)
Glucose, Bld: 102 mg/dL — ABNORMAL HIGH (ref 70–99)
Potassium: 3.9 mmol/L (ref 3.5–5.1)
Sodium: 138 mmol/L (ref 135–145)
Total Bilirubin: 0.8 mg/dL (ref 0.3–1.2)
Total Protein: 9 g/dL — ABNORMAL HIGH (ref 6.5–8.1)

## 2021-01-27 LAB — PROTIME-INR
INR: 1 (ref 0.8–1.2)
Prothrombin Time: 13.3 seconds (ref 11.4–15.2)

## 2021-01-27 LAB — CBC
HCT: 46.4 % (ref 39.0–52.0)
Hemoglobin: 15.7 g/dL (ref 13.0–17.0)
MCH: 31 pg (ref 26.0–34.0)
MCHC: 33.8 g/dL (ref 30.0–36.0)
MCV: 91.5 fL (ref 80.0–100.0)
Platelets: 270 10*3/uL (ref 150–400)
RBC: 5.07 MIL/uL (ref 4.22–5.81)
RDW: 13.9 % (ref 11.5–15.5)
WBC: 7.4 10*3/uL (ref 4.0–10.5)
nRBC: 0 % (ref 0.0–0.2)

## 2021-01-27 LAB — SURGICAL PCR SCREEN
MRSA, PCR: NEGATIVE
Staphylococcus aureus: NEGATIVE

## 2021-01-27 LAB — APTT: aPTT: 33 seconds (ref 24–36)

## 2021-01-27 NOTE — Progress Notes (Signed)
Anesthesia Review:  PCP: Cardiologist : Chest x-ray : EKG : Echo : Stress test: Cardiac Cath :  Activity level: can do a flight of stair swithouit difficulty  Sleep Study/ CPAP : none  Fasting Blood Sugar :      / Checks Blood Sugar -- times a day:   Blood Thinner/ Instructions /Last Dose: ASA / Instructions/ Last Dose :  Medical hx and instructions done via phone since called pt at home at 1020 and he had not ye tleft for appt.  PT to come in an pick up instructions, ensure preop drink and hibiclens and have labwork completed.

## 2021-01-27 NOTE — Progress Notes (Addendum)
Anesthesia Review:  PCP: DR Nadene Rubins  Preop visit on 01/12/21- epic  Cardiologist : none  Chest x-ray : EKG : Echo : Stress test: Cardiac Cath :  Activity level: can do a flight of stairs witout difficulty  Sleep Study/ CPAP : none  Fasting Blood Sugar :      / Checks Blood Sugar -- times a day:   Blood Thinner/ Instructions /Last Dose: ASA / Instructions/ Last Dose :  NO S/s of covid at preop appt .  No surgeon order for covid no covid test done.  Ambulatory .

## 2021-02-02 NOTE — Anesthesia Preprocedure Evaluation (Addendum)
Anesthesia Evaluation  Patient identified by MRN, date of birth, ID band Patient awake    Reviewed: Allergy & Precautions, NPO status , Patient's Chart, lab work & pertinent test results  Airway Mallampati: II  TM Distance: >3 FB Neck ROM: Full    Dental no notable dental hx.    Pulmonary neg pulmonary ROS, sleep apnea , former smoker,    Pulmonary exam normal breath sounds clear to auscultation       Cardiovascular Exercise Tolerance: Good negative cardio ROS Normal cardiovascular exam Rhythm:Regular Rate:Normal     Neuro/Psych negative neurological ROS  negative psych ROS   GI/Hepatic negative GI ROS, Neg liver ROS, GERD  ,  Endo/Other  negative endocrine ROS  Renal/GU negative Renal ROS  negative genitourinary   Musculoskeletal negative musculoskeletal ROS (+) Arthritis ,   Abdominal   Peds negative pediatric ROS (+)  Hematology negative hematology ROS (+)   Anesthesia Other Findings   Reproductive/Obstetrics negative OB ROS                           Anesthesia Physical Anesthesia Plan  ASA: II  Anesthesia Plan: Spinal   Post-op Pain Management:    Induction: Intravenous  PONV Risk Score and Plan: 1 and Propofol infusion, Ondansetron, TIVA and Treatment may vary due to age or medical condition  Airway Management Planned: Natural Airway and Simple Face Mask  Additional Equipment: None  Intra-op Plan:   Post-operative Plan: Extubation in OR  Informed Consent: I have reviewed the patients History and Physical, chart, labs and discussed the procedure including the risks, benefits and alternatives for the proposed anesthesia with the patient or authorized representative who has indicated his/her understanding and acceptance.       Plan Discussed with: CRNA and Anesthesiologist  Anesthesia Plan Comments: (Spinal anesthetic. Propofol gtt. GETA as backup. Tanna Furry, MD  )        Anesthesia Quick Evaluation

## 2021-02-03 ENCOUNTER — Encounter (HOSPITAL_COMMUNITY): Payer: Self-pay | Admitting: Orthopedic Surgery

## 2021-02-03 ENCOUNTER — Encounter (HOSPITAL_COMMUNITY)
Admission: RE | Disposition: A | Discharge status: Home / Self Care | Payer: Self-pay | Source: Ambulatory Visit | Attending: Orthopedic Surgery

## 2021-02-03 ENCOUNTER — Ambulatory Visit (HOSPITAL_COMMUNITY): Payer: 59

## 2021-02-03 ENCOUNTER — Ambulatory Visit (HOSPITAL_COMMUNITY)
Admission: RE | Admit: 2021-02-03 | Discharge: 2021-02-03 | Disposition: A | Discharge status: Home / Self Care | Payer: 59 | Source: Ambulatory Visit | Attending: Orthopedic Surgery | Admitting: Orthopedic Surgery

## 2021-02-03 ENCOUNTER — Ambulatory Visit (HOSPITAL_COMMUNITY): Payer: 59 | Admitting: Anesthesiology

## 2021-02-03 ENCOUNTER — Ambulatory Visit (HOSPITAL_COMMUNITY): Payer: 59 | Admitting: Physician Assistant

## 2021-02-03 DIAGNOSIS — Z471 Aftercare following joint replacement surgery: Secondary | ICD-10-CM | POA: Diagnosis not present

## 2021-02-03 DIAGNOSIS — M1612 Unilateral primary osteoarthritis, left hip: Secondary | ICD-10-CM | POA: Insufficient documentation

## 2021-02-03 DIAGNOSIS — Z419 Encounter for procedure for purposes other than remedying health state, unspecified: Secondary | ICD-10-CM

## 2021-02-03 DIAGNOSIS — Z96661 Presence of right artificial ankle joint: Secondary | ICD-10-CM | POA: Insufficient documentation

## 2021-02-03 DIAGNOSIS — K219 Gastro-esophageal reflux disease without esophagitis: Secondary | ICD-10-CM | POA: Diagnosis not present

## 2021-02-03 DIAGNOSIS — Z87891 Personal history of nicotine dependence: Secondary | ICD-10-CM | POA: Insufficient documentation

## 2021-02-03 DIAGNOSIS — G4730 Sleep apnea, unspecified: Secondary | ICD-10-CM | POA: Diagnosis not present

## 2021-02-03 DIAGNOSIS — G4710 Hypersomnia, unspecified: Secondary | ICD-10-CM | POA: Diagnosis not present

## 2021-02-03 DIAGNOSIS — Z96649 Presence of unspecified artificial hip joint: Secondary | ICD-10-CM

## 2021-02-03 DIAGNOSIS — Z96642 Presence of left artificial hip joint: Secondary | ICD-10-CM | POA: Diagnosis not present

## 2021-02-03 HISTORY — DX: Unspecified osteoarthritis, unspecified site: M19.90

## 2021-02-03 HISTORY — DX: Sleep apnea, unspecified: G47.30

## 2021-02-03 HISTORY — PX: TOTAL HIP ARTHROPLASTY: SHX124

## 2021-02-03 LAB — TYPE AND SCREEN
ABO/RH(D): B POS
Antibody Screen: NEGATIVE

## 2021-02-03 LAB — ABO/RH: ABO/RH(D): B POS

## 2021-02-03 SURGERY — ARTHROPLASTY, HIP, TOTAL, ANTERIOR APPROACH
Anesthesia: Spinal | Site: Hip | Laterality: Left

## 2021-02-03 MED ORDER — ONDANSETRON HCL 4 MG/2ML IJ SOLN
INTRAMUSCULAR | Status: DC | PRN
  Administered 2021-02-03: 4 mg via INTRAVENOUS

## 2021-02-03 MED ORDER — SODIUM CHLORIDE 0.9 % IR SOLN
Status: DC | PRN
  Administered 2021-02-03: 1000 mL

## 2021-02-03 MED ORDER — METHOCARBAMOL 500 MG IVPB - SIMPLE MED
INTRAVENOUS | Status: AC
  Filled 2021-02-03: qty 50

## 2021-02-03 MED ORDER — PHENYLEPHRINE HCL-NACL 10-0.9 MG/250ML-% IV SOLN
INTRAVENOUS | Status: DC | PRN
  Administered 2021-02-03: 25 ug/min via INTRAVENOUS

## 2021-02-03 MED ORDER — CEFAZOLIN SODIUM-DEXTROSE 2-4 GM/100ML-% IV SOLN: 2.0000 g | Freq: Four times a day (QID) | INTRAVENOUS | Status: DC

## 2021-02-03 MED ORDER — TRANEXAMIC ACID-NACL 1000-0.7 MG/100ML-% IV SOLN
1000.0000 mg | INTRAVENOUS | Status: AC
  Administered 2021-02-03: 1000 mg via INTRAVENOUS
  Filled 2021-02-03: qty 100

## 2021-02-03 MED ORDER — METHOCARBAMOL 500 MG IVPB - SIMPLE MED
500.0000 mg | Freq: Four times a day (QID) | INTRAVENOUS | Status: DC | PRN
  Administered 2021-02-03: 500 mg via INTRAVENOUS

## 2021-02-03 MED ORDER — PROPOFOL 10 MG/ML IV BOLUS
INTRAVENOUS | Status: DC | PRN
  Administered 2021-02-03: 30 mg via INTRAVENOUS

## 2021-02-03 MED ORDER — POVIDONE-IODINE 10 % EX SWAB
2.0000 "application " | Freq: Once | CUTANEOUS | Status: AC
  Administered 2021-02-03: 2 via TOPICAL

## 2021-02-03 MED ORDER — MIDAZOLAM HCL 2 MG/2ML IJ SOLN
INTRAMUSCULAR | Status: AC
  Filled 2021-02-03: qty 2

## 2021-02-03 MED ORDER — METHOCARBAMOL 500 MG PO TABS: 500.0000 mg | ORAL_TABLET | Freq: Four times a day (QID) | ORAL | Status: DC | PRN

## 2021-02-03 MED ORDER — STERILE WATER FOR IRRIGATION IR SOLN
Status: DC | PRN
  Administered 2021-02-03: 1000 mL

## 2021-02-03 MED ORDER — FENTANYL CITRATE (PF) 100 MCG/2ML IJ SOLN
INTRAMUSCULAR | Status: AC
  Filled 2021-02-03: qty 2

## 2021-02-03 MED ORDER — ONDANSETRON HCL 4 MG/2ML IJ SOLN
INTRAMUSCULAR | Status: AC
  Filled 2021-02-03: qty 2

## 2021-02-03 MED ORDER — TRANEXAMIC ACID-NACL 1000-0.7 MG/100ML-% IV SOLN
1000.0000 mg | Freq: Once | INTRAVENOUS | Status: AC
  Administered 2021-02-03: 1000 mg via INTRAVENOUS
  Filled 2021-02-03: qty 100

## 2021-02-03 MED ORDER — HYDROMORPHONE HCL 1 MG/ML IJ SOLN: 0.2500 mg | INTRAMUSCULAR | Status: DC | PRN

## 2021-02-03 MED ORDER — PROPOFOL 500 MG/50ML IV EMUL
INTRAVENOUS | Status: DC | PRN
  Administered 2021-02-03: 100 ug/kg/min via INTRAVENOUS

## 2021-02-03 MED ORDER — OXYCODONE HCL 5 MG PO TABS
ORAL_TABLET | ORAL | Status: AC
  Filled 2021-02-03: qty 1

## 2021-02-03 MED ORDER — LACTATED RINGERS IV BOLUS
250.0000 mL | Freq: Once | INTRAVENOUS | Status: AC
  Administered 2021-02-03: 250 mL via INTRAVENOUS

## 2021-02-03 MED ORDER — PROPOFOL 1000 MG/100ML IV EMUL
INTRAVENOUS | Status: AC
  Filled 2021-02-03: qty 200

## 2021-02-03 MED ORDER — FENTANYL CITRATE (PF) 100 MCG/2ML IJ SOLN
INTRAMUSCULAR | Status: DC | PRN
  Administered 2021-02-03: 100 ug via INTRAVENOUS

## 2021-02-03 MED ORDER — FENTANYL CITRATE (PF) 100 MCG/2ML IJ SOLN
25.0000 ug | INTRAMUSCULAR | Status: DC | PRN
  Administered 2021-02-03: 25 ug via INTRAVENOUS
  Administered 2021-02-03: 50 ug via INTRAVENOUS
  Administered 2021-02-03: 25 ug via INTRAVENOUS

## 2021-02-03 MED ORDER — PROPOFOL 10 MG/ML IV BOLUS
INTRAVENOUS | Status: AC
  Filled 2021-02-03: qty 20

## 2021-02-03 MED ORDER — DEXAMETHASONE SODIUM PHOSPHATE 10 MG/ML IJ SOLN
8.0000 mg | Freq: Once | INTRAMUSCULAR | Status: AC
  Administered 2021-02-03: 10 mg via INTRAVENOUS

## 2021-02-03 MED ORDER — CEFAZOLIN SODIUM-DEXTROSE 2-4 GM/100ML-% IV SOLN
2.0000 g | INTRAVENOUS | Status: AC
Start: 2021-02-03 — End: 2021-02-03
  Administered 2021-02-03: 2 g via INTRAVENOUS
  Filled 2021-02-03: qty 100

## 2021-02-03 MED ORDER — OXYCODONE HCL 5 MG/5ML PO SOLN
5.0000 mg | Freq: Once | ORAL | Status: AC | PRN
Start: 2021-02-03 — End: 2021-02-03

## 2021-02-03 MED ORDER — LACTATED RINGERS IV BOLUS
500.0000 mL | Freq: Once | INTRAVENOUS | Status: AC
  Administered 2021-02-03: 500 mL via INTRAVENOUS

## 2021-02-03 MED ORDER — LACTATED RINGERS IV SOLN
INTRAVENOUS | Status: DC
Start: 2021-02-03 — End: 2021-02-04

## 2021-02-03 MED ORDER — DEXAMETHASONE SODIUM PHOSPHATE 10 MG/ML IJ SOLN
INTRAMUSCULAR | Status: AC
  Filled 2021-02-03: qty 1

## 2021-02-03 MED ORDER — CEFAZOLIN SODIUM-DEXTROSE 2-4 GM/100ML-% IV SOLN
INTRAVENOUS | Status: AC
  Administered 2021-02-03: 2 g via INTRAVENOUS
  Filled 2021-02-03: qty 100

## 2021-02-03 MED ORDER — PROMETHAZINE HCL 25 MG/ML IJ SOLN: 6.2500 mg | INTRAMUSCULAR | Status: DC | PRN

## 2021-02-03 MED ORDER — MIDAZOLAM HCL 5 MG/5ML IJ SOLN
INTRAMUSCULAR | Status: DC | PRN
  Administered 2021-02-03: 2 mg via INTRAVENOUS

## 2021-02-03 MED ORDER — PROPOFOL 1000 MG/100ML IV EMUL
INTRAVENOUS | Status: AC
  Filled 2021-02-03: qty 100

## 2021-02-03 MED ORDER — ACETAMINOPHEN 500 MG PO TABS
1000.0000 mg | ORAL_TABLET | Freq: Once | ORAL | Status: AC
  Administered 2021-02-03: 1000 mg via ORAL
  Filled 2021-02-03: qty 2

## 2021-02-03 MED ORDER — OXYCODONE HCL 5 MG PO TABS
5.0000 mg | ORAL_TABLET | Freq: Once | ORAL | Status: AC | PRN
  Administered 2021-02-03: 5 mg via ORAL

## 2021-02-03 SURGICAL SUPPLY — 41 items
ADH SKN CLS APL DERMABOND .7 (GAUZE/BANDAGES/DRESSINGS) ×1
BAG DECANTER FOR FLEXI CONT (MISCELLANEOUS) IMPLANT
BAG SPEC THK2 15X12 ZIP CLS (MISCELLANEOUS)
BAG ZIPLOCK 12X15 (MISCELLANEOUS) IMPLANT
BLADE SAG 18X100X1.27 (BLADE) ×2 IMPLANT
COVER PERINEAL POST (MISCELLANEOUS) ×2 IMPLANT
COVER SURGICAL LIGHT HANDLE (MISCELLANEOUS) ×2 IMPLANT
COVER WAND RF STERILE (DRAPES) IMPLANT
CUP ACET PINNCLE SECTR II 62MM (Hips) IMPLANT
DERMABOND ADVANCED (GAUZE/BANDAGES/DRESSINGS) ×1
DERMABOND ADVANCED .7 DNX12 (GAUZE/BANDAGES/DRESSINGS) ×1 IMPLANT
DRAPE STERI IOBAN 125X83 (DRAPES) ×2 IMPLANT
DRAPE U-SHAPE 47X51 STRL (DRAPES) ×4 IMPLANT
DRESSING AQUACEL AG SP 3.5X10 (GAUZE/BANDAGES/DRESSINGS) ×1 IMPLANT
DRSG AQUACEL AG SP 3.5X10 (GAUZE/BANDAGES/DRESSINGS) ×2
DURAPREP 26ML APPLICATOR (WOUND CARE) ×2 IMPLANT
ELECT REM PT RETURN 15FT ADLT (MISCELLANEOUS) ×2 IMPLANT
ELIMINATOR HOLE APEX DEPUY (Hips) ×1 IMPLANT
GLOVE SURG ENC MOIS LTX SZ6 (GLOVE) ×4 IMPLANT
GLOVE SURG UNDER LTX SZ7.5 (GLOVE) ×2 IMPLANT
GLOVE SURG UNDER POLY LF SZ6.5 (GLOVE) ×2 IMPLANT
GLOVE SURG UNDER POLY LF SZ7.5 (GLOVE) ×4 IMPLANT
GOWN STRL REUS W/TWL LRG LVL3 (GOWN DISPOSABLE) ×4 IMPLANT
HEAD CERAMIC 36 PLUS 8.5 12 14 (Hips) ×1 IMPLANT
HOLDER FOLEY CATH W/STRAP (MISCELLANEOUS) ×2 IMPLANT
KIT TURNOVER KIT A (KITS) ×2 IMPLANT
LINER NEUTRAL 62MMC36MM P4 (Liner) ×1 IMPLANT
PACK ANTERIOR HIP CUSTOM (KITS) ×2 IMPLANT
PENCIL SMOKE EVACUATOR (MISCELLANEOUS) ×1 IMPLANT
PINNACLE SECTOR II CUP 62MM (Hips) ×2 IMPLANT
SCREW 6.5MMX30MM (Screw) ×1 IMPLANT
STEM FEM ACTIS HIGH SZ7 (Stem) ×1 IMPLANT
SUT MNCRL AB 4-0 PS2 18 (SUTURE) ×2 IMPLANT
SUT STRATAFIX 0 PDS 27 VIOLET (SUTURE) ×2
SUT VIC AB 1 CT1 36 (SUTURE) ×6 IMPLANT
SUT VIC AB 2-0 CT1 27 (SUTURE) ×4
SUT VIC AB 2-0 CT1 TAPERPNT 27 (SUTURE) ×2 IMPLANT
SUTURE STRATFX 0 PDS 27 VIOLET (SUTURE) ×1 IMPLANT
TRAY FOLEY MTR SLVR 16FR STAT (SET/KITS/TRAYS/PACK) ×1 IMPLANT
TUBE SUCTION HIGH CAP CLEAR NV (SUCTIONS) ×2 IMPLANT
WATER STERILE IRR 1000ML POUR (IV SOLUTION) ×2 IMPLANT

## 2021-02-03 NOTE — Anesthesia Procedure Notes (Addendum)
Procedure Name: MAC Date/Time: 02/03/2021 11:30 AM Performed by: Lissa Morales, CRNA Pre-anesthesia Checklist: Emergency Drugs available, Patient identified, Suction available, Patient being monitored and Timeout performed Patient Re-evaluated:Patient Re-evaluated prior to induction Oxygen Delivery Method: Simple face mask Placement Confirmation: positive ETCO2

## 2021-02-03 NOTE — Discharge Instructions (Signed)

## 2021-02-03 NOTE — Op Note (Signed)
NAME:  Chad Hernandez                ACCOUNT NO.: [id number]      MEDICAL RECORD NO.: [id number]      FACILITY:  [facility]      PHYSICIAN:  [name]  DATE OF BIRTH:  May 06, 1971     DATE OF PROCEDURE:  02/03/2021                                 OPERATIVE REPORT         PREOPERATIVE DIAGNOSIS: Left  hip osteoarthritis.      POSTOPERATIVE DIAGNOSIS:  Left hip osteoarthritis.      PROCEDURE:  Left total hip replacement through an anterior approach   utilizing DePuy THR system, component size 62 mm pinnacle cup, a size 36+4 neutral   Altrex liner, a size 7 Hi Actis stem with a 36+8.5 delta ceramic   ball.      SURGEON:  Madlyn Frankel. Charlann Boxer, M.D.      ASSISTANT:  Dennie Bible, PA-C     ANESTHESIA:  Spinal.      SPECIMENS:  None.      COMPLICATIONS:  None.      BLOOD LOSS:  800 cc     DRAINS:  None.      INDICATION OF THE PROCEDURE:  Chad Hernandez is a 50 y.o. male who had   presented to office for evaluation of left hip pain.  Radiographs revealed   progressive degenerative changes with bone-on-bone   articulation of the  hip joint, including subchondral cystic changes and osteophytes.  The patient had painful limited range of   motion significantly affecting their overall quality of life and function.  The patient was failing to    respond to conservative measures including medications and/or injections and activity modification and at this point was ready   to proceed with more definitive measures.  Consent was obtained for   benefit of pain relief.  Specific risks of infection, DVT, component   failure, dislocation, neurovascular injury, and need for revision surgery were reviewed in the office as well discussion of   the anterior versus posterior approach were reviewed.     PROCEDURE IN DETAIL:  The patient was brought to operative theater.   Once adequate anesthesia, preoperative antibiotics, 2 gm of Ancef, 1 gm of Tranexamic Acid, and 10  mg of Decadron were administered, the patient was positioned supine on the [employer] table.  Once the patient was safely positioned with adequate padding of boney prominences we predraped out the hip, and used fluoroscopy to confirm orientation of the pelvis.      The left hip was then prepped and draped from proximal iliac crest to   mid thigh with a shower curtain technique.      Time-out was performed identifying the patient, planned procedure, and the appropriate extremity.     An incision was then made 2 cm lateral to the   anterior superior iliac spine extending over the orientation of the   tensor fascia lata muscle and sharp dissection was carried down to the   fascia of the muscle.      The fascia was then incised.  The muscle belly was identified and swept   laterally and retractor placed along the superior neck.  Following   cauterization of the circumflex vessels and removing some  pericapsular   fat, a second cobra retractor was placed on the inferior neck.  A T-capsulotomy was made along the line of the   superior neck to the trochanteric fossa, then extended proximally and   distally.  Tag sutures were placed and the retractors were then placed   intracapsular.  We then identified the trochanteric fossa and   orientation of my neck cut and then made a neck osteotomy with the femur on traction.  The femoral   head was removed without difficulty or complication.  Traction was let   off and retractors were placed posterior and anterior around the   acetabulum.      The labrum and foveal tissue were debrided.  I began reaming with a 52 mm   reamer and reamed up to 61 mm reamer with good bony bed preparation and a 62 mm  cup was chosen.  The final 62 mm Pinnacle cup was then impacted under fluoroscopy to confirm the depth of penetration and orientation with respect to   Abduction and forward flexion.  A screw was placed into the ilium followed by the hole eliminator.  The final    36+4 neutral Altrex liner was impacted with good visualized rim fit.  The cup was positioned anatomically within the acetabular portion of the pelvis.      At this point, the femur was rolled to 100 degrees.  Further capsule was   released off the inferior aspect of the femoral neck.  I then   released the superior capsule proximally.  With the leg in a neutral position the hook was placed laterally   along the femur under the vastus lateralis origin and elevated manually and then held in position using the hook attachment on the bed.  The leg was then extended and adducted with the leg rolled to 100   degrees of external rotation.  Retractors were placed along the medial calcar and posteriorly over the greater trochanter.  Once the proximal femur was fully   exposed, I used a box osteotome to set orientation.  I then began   broaching with the starting chili pepper broach and passed this by hand and then broached up to 7.  With the 7 broach in place I chose a high offset neck and did several trial reductions.  The offset was appropriate, leg lengths   appeared to be equal best matched with the +8.5 head ball trial confirmed radiographically.   Given these findings, I went ahead and dislocated the hip, repositioned all   retractors and positioned the right hip in the extended and abducted position.  The final 7 Hi Actis stem was   chosen and it was impacted down to the level of neck cut.  Based on this   and the trial reductions, a final 36+8.5 delta ceramic ball was chosen and   impacted onto a clean and dry trunnion, and the hip was reduced.  The   hip had been irrigated throughout the case again at this point.  I did   reapproximate the superior capsular leaflet to the anterior leaflet   using #1 Vicryl.  The fascia of the   tensor fascia lata muscle was then reapproximated using #1 Vicryl and #0 Stratafix sutures.  The   remaining wound was closed with 2-0 Vicryl and running 4-0 Monocryl.    The hip was cleaned, dried, and dressed sterilely using Dermabond and   Aquacel dressing.  The patient was then brought   to  recovery room in stable condition tolerating the procedure well.    Dennie Bible, PA-C was present for the entirety of the case involved from   preoperative positioning, perioperative retractor management, general   facilitation of the case, as well as primary wound closure as assistant.            Madlyn Frankel Charlann Boxer, M.D.        02/03/2021 10:13 AM

## 2021-02-03 NOTE — H&P (Signed)
TOTAL HIP ADMISSION H&P  Patient is admitted for left total hip arthroplasty.  Subjective:  Chief Complaint: left hip pain  HPI: Chad Hernandez, 50 y.o. male, has a history of pain and functional disability in the left hip(s) due to arthritis and patient has failed non-surgical conservative treatments for greater than 12 weeks to include NSAID's and/or analgesics and activity modification.  Onset of symptoms was gradual starting 2 years ago with gradually worsening course since that time.The patient noted no past surgery on the left hip(s).  Patient currently rates pain in the left hip at 9 out of 10 with activity. Patient has worsening of pain with activity and weight bearing, pain that interfers with activities of daily living and pain with passive range of motion. Patient has evidence of joint space narrowing by imaging studies. This condition presents safety issues increasing the risk of falls..  There is no current active infection.  Patient Active Problem List   Diagnosis Date Noted  . Osteoarthritis of left hip 01/12/2021  . Obesity (BMI 30-39.9) 01/12/2021  . 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 Snoring 11/08/2018  . Hypersomnia with sleep apnea 11/08/2018  . Excessive daytime sleepiness 11/08/2018  . OA (osteoarthritis) of ankle 03/28/2013  . Ankle joint instability 03/28/2013  . Healthcare maintenance 12/22/2012   Past Medical History:  Diagnosis Date  . Arthritis   . Closed nondisp fracture of right medial malleolus with routine healing   . GERD (gastroesophageal reflux disease)   . Sleep apnea    mild no cpap     Past Surgical History:  Procedure Laterality Date  . EYE SURGERY    . HAND SURGERY    . ORIF ANKLE FRACTURE Right 06/19/2020   Procedure: Open Reduction Internal Fixation (ORIF) Right medial malleolus fracture;  Surgeon: Toni Arthurs, MD;  Location: [facility];  Service: Orthopedics;  Laterality: Right;  . TOTAL ANKLE ARTHROPLASTY  Right 04/10/2020   Procedure: TOTAL ANKLE ARTHROPLASTY;  Surgeon: Toni Arthurs, MD;  Location: [facility];  Service: Orthopedics;  Laterality: Right;    No current facility-administered medications for this encounter.   Current Outpatient Medications  Medication Sig Dispense Refill Last Dose  . ibuprofen (ADVIL) 200 MG tablet Take 600 mg by mouth 3 (three) times daily as needed (pain.).     Marland Kitchen Ibuprofen-Famotidine (DUEXIS) 800-26.6 MG TABS Take 1 tablet by mouth 3 (three) times daily. (Patient taking differently: Take 1 tablet by mouth 3 (three) times daily as needed (pain.).) 90 tablet 1   . oxybutynin (DITROPAN XL) 5 MG 24 hr tablet Take 1 tablet (5 mg total) by mouth at bedtime. (Patient not taking: Reported on 01/20/2021) 30 tablet 2 Not Taking at Unknown time   No Known Allergies  Social History   Tobacco Use  . Smoking status: Former Smoker    Types: Cigars  . Smokeless tobacco: Never Used  . Tobacco comment: occassionally smokes cigars  Substance Use Topics  . Alcohol use: Yes    Alcohol/week: 3.0 standard drinks    Types: 3 Cans of beer per week    Comment: occassional    Family History  Problem Relation Age of Onset  . Hyperlipidemia Mother   . Hypertension Mother   . Breast cancer Mother   . Hypertension Brother   . Diabetes Neg Hx   . Heart attack Neg Hx   . Sudden death Neg Hx   . Cancer Neg Hx   . COPD Neg Hx   .  Early death Neg Hx   . Hearing loss Neg Hx   . Heart disease Neg Hx   . Kidney disease Neg Hx   . Stroke Neg Hx      Review of Systems  Constitutional: Negative for chills and fever.  Respiratory: Negative for cough and shortness of breath.   Cardiovascular: Negative for chest pain.  Gastrointestinal: Negative for nausea and vomiting.  Musculoskeletal: Positive for arthralgias.    Objective:  Physical Exam Well nourished and well developed. General: Alert and oriented x3, cooperative and pleasant, no acute distress. Head:  normocephalic, atraumatic, neck supple. Eyes: EOMI.  Musculoskeletal: Left hip exam: Very limited and painful hip range of motion. Hip flexion internal rotation just to neutral with external rotation to about 10 degrees  Calves soft and nontender. Motor function intact in LE. Strength 5/5 LE bilaterally. Neuro: Distal pulses 2+. Sensation to light touch intact in LE. Vital signs in last 24 hours:    Labs:   Estimated body mass index is 28.1 kg/m as calculated from the following:   Height as of 01/27/21: 6\' 1"  (1.854 m).   Weight as of 01/27/21: 96.6 kg.   Imaging Review Plain radiographs demonstrate severe degenerative joint disease of the left hip(s). The bone quality appears to be adequate for age and reported activity level.  Assessment/Plan:  End stage arthritis, left hip(s)  The patient history, physical examination, clinical judgement of the provider and imaging studies are consistent with end stage degenerative joint disease of the left hip(s) and total hip arthroplasty is deemed medically necessary. The treatment options including medical management, injection therapy, arthroscopy and arthroplasty were discussed at length. The risks and benefits of total hip arthroplasty were presented and reviewed. The risks due to aseptic loosening, infection, stiffness, dislocation/subluxation,  thromboembolic complications and other imponderables were discussed.  The patient acknowledged the explanation, agreed to proceed with the plan and consent was signed. Patient is being admitted for inpatient treatment for surgery, pain control, PT, OT, prophylactic antibiotics, VTE prophylaxis, progressive ambulation and ADL's and discharge planning.The patient is planning to be discharged home.  Therapy Plans: HEP Disposition: Home with wife Planned DVT Prophylaxis: aspirin 81mg  BID DME needed: 3-n-1 PCP: Dr. 01/29/21, cleared for surgery based on office note 5/2 TXA: IV Allergies:  NKDA Anesthesia Concerns: none BMI: 29.4 Not diabetic.  Other: - Hydrocodone is okay, meds sent   , PA-C Orthopedic Surgery [facility] [place] [phone]

## 2021-02-03 NOTE — Interval H&P Note (Signed)
History and Physical Interval Note:  02/03/2021 10:02 AM  Chad Hernandez  has presented today for surgery, with the diagnosis of left hip osteoarthritis.  The various methods of treatment have been discussed with the patient and family. After consideration of risks, benefits and other options for treatment, the patient has consented to  Procedure(s) with comments: TOTAL HIP ARTHROPLASTY ANTERIOR APPROACH (Left) - as a surgical intervention.  The patient's history has been reviewed, patient examined, no change in status, stable for surgery.  I have reviewed the patient's chart and labs.  Questions were answered to the patient's satisfaction.     [name]

## 2021-02-03 NOTE — Transfer of Care (Signed)
Immediate Anesthesia Transfer of Care Note  Patient: Chad Hernandez  Procedure(s) Performed: TOTAL HIP ARTHROPLASTY ANTERIOR APPROACH (Left Hip)  Patient Location: PACU  Anesthesia Type:Spinal  Level of Consciousness: sedated and patient cooperative  Airway & Oxygen Therapy: Patient Spontanous Breathing and Patient connected to face mask oxygen  Post-op Assessment: Report given to RN and Post -op Vital signs reviewed and stable  Post vital signs: stable  Last Vitals:  Vitals Value Taken Time  BP    Temp    Pulse 77 02/03/21 1335  Resp 19 02/03/21 1335  SpO2 98 % 02/03/21 1335  Vitals shown include unvalidated device data.  Last Pain:  Vitals:   02/03/21 0948  TempSrc:   PainSc: 3       Patients Stated Pain Goal: 3 (02/03/21 0948)  Complications: No complications documented.

## 2021-02-03 NOTE — Anesthesia Procedure Notes (Signed)
Spinal  Patient location during procedure: OR End time: 02/03/2021 11:39 AM Reason for block: surgical anesthesia Staffing Performed: resident/CRNA  Resident/CRNA: Lissa Morales, CRNA Preanesthetic Checklist Completed: patient identified, IV checked, site marked, risks and benefits discussed, surgical consent, monitors and equipment checked, pre-op evaluation and timeout performed Spinal Block Patient position: sitting Prep: DuraPrep Patient monitoring: heart rate, continuous pulse ox and blood pressure Approach: midline Location: L4-5 Injection technique: single-shot Needle Needle type: Pencan  Needle gauge: 24 G Needle length: 9 cm Assessment Events: CSF return Additional Notes Expiration date of kit checked and confirmed. Patient tolerated procedure well, without complications.

## 2021-02-03 NOTE — Evaluation (Signed)
Physical Therapy Evaluation Patient Details Name: Chad Hernandez MRN: [id number] DOB: Mar 20, 1971 Today's Date: 02/03/2021   History of Present Illness  pt s/p L THA with DC orders fromt eh PACU. PMH: R ankle replacement.  Clinical Impression  Pt s/p LTHA with orders to DC from PACU. Pt tolerated session well with ambulation step through pattern due to still a little instability with muscle control post anesthesia, exercises and progression given to patient. educated verbally with stair training, however pt stated he was going to stay on main level for a little while.  All mobility and education complete , ready for DC from PT's standpoint. however pt still has not urinated.     Follow Up Recommendations Follow surgeon's recommendation for DC plan and follow-up therapies    Equipment Recommendations  None recommended by PT    Recommendations for Other Services       Precautions / Restrictions Precautions Precautions: None Restrictions Weight Bearing Restrictions: No      Mobility  Bed Mobility Overal bed mobility: Modified Independent                  Transfers Overall transfer level: Needs assistance Equipment used: Rolling walker (2 wheeled) Transfers: Sit to/from Stand           General transfer comment: RW safety and hand placement cues  Ambulation/Gait Ambulation/Gait assistance: Min guard Gait Distance (Feet): 60 Feet Assistive device: Rolling walker (2 wheeled) Gait Pattern/deviations: Step-to pattern     General Gait Details: educated with step tp pattern for safety to prevent buckling of LLE due to still not completely "awake" with muscle control  Stairs            Wheelchair Mobility    Modified Rankin (Stroke Patients Only)       Balance Overall balance assessment: Needs assistance Sitting-balance support: Feet supported;No upper extremity supported Sitting balance-Leahy Scale: Good     Standing balance support: Bilateral  upper extremity supported;During functional activity Standing balance-Leahy Scale: Fair                               Pertinent Vitals/Pain Pain Assessment: 0-10 Pain Score: 4  Pain Location: L anterior thigh area Pain Descriptors / Indicators: Aching;Sore Pain Intervention(s): Monitored during session    Home Living Family/patient expects to be discharged to:: Private residence Living Arrangements: Spouse/significant other Available Help at Discharge: Family Type of Home: House Home Access: Level entry     Home Layout: Two level;Able to live on main level with bedroom/bathroom Home Equipment: Dan Humphreys - 2 wheels;Cane - single point;Crutches      Prior Function Level of Independence: Independent with assistive device(s)         Comments: pt has been using cane due to R ankle replacement.     Hand Dominance        Extremity/Trunk Assessment        Lower Extremity Assessment Lower Extremity Assessment: Overall WFL for tasks assessed       Communication   Communication: No difficulties  Cognition Arousal/Alertness: Awake/alert Behavior During Therapy: WFL for tasks assessed/performed Overall Cognitive Status: Within Functional Limits for tasks assessed                                        General Comments      Exercises Total Joint  Exercises Ankle Circles/Pumps: AROM;Left;5 reps;Supine Quad Sets: AROM;Left;5 reps;Supine Short Arc Quad: AROM;Supine;Left;5 reps Heel Slides: AROM;Supine;Left;5 reps Hip ABduction/ADduction: AROM;Supine;Left;5 reps   Assessment/Plan    PT Assessment Patent does not need any further PT services  PT Problem List         PT Treatment Interventions      PT Goals (Current goals can be found in the Care Plan section)  Acute Rehab PT Goals Patient Stated Goal: pt's goal of session was to be able to complete ambulation and exercises training in order to DC home safely from PACU with wife  assisting PT Goal Formulation: All assessment and education complete, DC therapy    Frequency     Barriers to discharge        Co-evaluation               AM-PAC PT "6 Clicks" Mobility  Outcome Measure Help needed turning from your back to your side while in a flat bed without using bedrails?: None Help needed moving from lying on your back to sitting on the side of a flat bed without using bedrails?: None Help needed moving to and from a bed to a chair (including a wheelchair)?: A Little Help needed standing up from a chair using your arms (e.g., wheelchair or bedside chair)?: A Little Help needed to walk in hospital room?: A Little Help needed climbing 3-5 steps with a railing? : A Little 6 Click Score: 20    End of Session Equipment Utilized During Treatment: Gait belt Activity Tolerance: Patient tolerated treatment well Patient left: in chair Nurse Communication: Mobility status PT Visit Diagnosis: Other abnormalities of gait and mobility (R26.89)    Time: 1645-1730 PT Time Calculation (min) (ACUTE ONLY): 45 min   Charges:   PT Evaluation $PT Eval Low Complexity: 1 Low PT Treatments $Gait Training: 8-22 mins $Therapeutic Exercise: 8-22 mins        Damariz Paganelli, PT, MPT Acute Rehabilitation Services Office: [phone] Pager: [phone] 02/03/2021   Chad Hernandez 02/03/2021, 10:40 PM

## 2021-02-04 ENCOUNTER — Encounter (HOSPITAL_COMMUNITY): Payer: Self-pay | Admitting: Orthopedic Surgery

## 2021-02-04 NOTE — Anesthesia Postprocedure Evaluation (Signed)
Anesthesia Post Note  Patient: Chad Hernandez  Procedure(s) Performed: TOTAL HIP ARTHROPLASTY ANTERIOR APPROACH (Left Hip)     Patient location during evaluation: PACU Anesthesia Type: Spinal Level of consciousness: awake and alert Pain management: pain level controlled Vital Signs Assessment: post-procedure vital signs reviewed and stable Respiratory status: spontaneous breathing, nonlabored ventilation, respiratory function stable and patient connected to nasal cannula oxygen Cardiovascular status: blood pressure returned to baseline and stable Postop Assessment: no apparent nausea or vomiting and spinal receding Anesthetic complications: no   No complications documented.  Last Vitals:  Vitals:   02/03/21 1945 02/03/21 1950  BP:  (!) 168/72  Pulse: 81 83  Resp:  16  Temp:  36.9 C  SpO2: 100% 100%    Last Pain:  Vitals:   02/03/21 1950  TempSrc:   PainSc: 0-No pain                 Jaleeya Mcnelly L Melinda Gwinner

## 2021-03-11 DIAGNOSIS — M1611 Unilateral primary osteoarthritis, right hip: Secondary | ICD-10-CM | POA: Diagnosis not present

## 2021-03-11 DIAGNOSIS — Z471 Aftercare following joint replacement surgery: Secondary | ICD-10-CM | POA: Diagnosis not present

## 2021-03-11 DIAGNOSIS — Z96642 Presence of left artificial hip joint: Secondary | ICD-10-CM | POA: Diagnosis not present

## 2021-04-08 DIAGNOSIS — Z20822 Contact with and (suspected) exposure to covid-19: Secondary | ICD-10-CM | POA: Diagnosis not present

## 2021-04-13 DIAGNOSIS — Z20822 Contact with and (suspected) exposure to covid-19: Secondary | ICD-10-CM | POA: Diagnosis not present

## 2021-05-20 DIAGNOSIS — M19171 Post-traumatic osteoarthritis, right ankle and foot: Secondary | ICD-10-CM | POA: Diagnosis not present

## 2021-05-20 DIAGNOSIS — M25571 Pain in right ankle and joints of right foot: Secondary | ICD-10-CM | POA: Diagnosis not present

## 2021-05-29 DIAGNOSIS — M1611 Unilateral primary osteoarthritis, right hip: Secondary | ICD-10-CM | POA: Diagnosis not present

## 2021-05-29 DIAGNOSIS — M25551 Pain in right hip: Secondary | ICD-10-CM | POA: Diagnosis not present

## 2021-05-29 DIAGNOSIS — Z96642 Presence of left artificial hip joint: Secondary | ICD-10-CM | POA: Diagnosis not present

## 2021-07-14 NOTE — Progress Notes (Addendum)
Anesthesia Review:  PCP: DR Nadene Rubins Clearance 06/04/21 on chart  Cardiologist : none  Chest x-ray : EKG : Echo : Stress test: Cardiac Cath :  Activity level: can do a flight of stairs without difficulty  Sleep Study/ CPAP : none  Fasting Blood Sugar :      / Checks Blood Sugar -- times a day:   Blood Thinner/ Instructions /Last Dose: ASA / Instructions/ Last Dose :   Covid test on 07/24/21

## 2021-07-15 NOTE — Progress Notes (Signed)
DUE TO COVID-19 ONLY ONE VISITOR IS ALLOWED TO COME WITH YOU AND STAY IN THE WAITING ROOM ONLY DURING PRE OP AND PROCEDURE DAY OF SURGERY.  2 VISITOR  MAY VISIT WITH YOU AFTER SURGERY IN YOUR PRIVATE ROOM DURING VISITING HOURS ONLY!  YOU NEED TO HAVE A COVID 19 TEST ON_  07/24/2021 @_  @_from  8am-3pm _____, THIS TEST MUST BE DONE BEFORE SURGERY,  Covid test is done at [address] [place], [address].  This is a drive thru.  No appt required. Please see map.                 Your procedure is scheduled on:    07/28/2021   Report to [facility] Main  Entrance   Report to admitting at   0845 AM     Call this number if you have problems the morning of surgery [phone]    REMEMBER: NO  SOLID FOOD CANDY OR GUM AFTER MIDNIGHT. CLEAR LIQUIDS UNTIL    0830am        . NOTHING BY MOUTH EXCEPT CLEAR LIQUIDS UNTIL    0830am   . PLEASE FINISH ENSURE DRINK PER SURGEON ORDER  WHICH NEEDS TO BE COMPLETED AT   0830am    .      CLEAR LIQUID DIET   Foods Allowed                                                                    Coffee and tea, regular and decaf                            Fruit ices (not with fruit pulp)                                      Iced Popsicles                                    Carbonated beverages, regular and diet                                    Cranberry, grape and apple juices Sports drinks like Gatorade Lightly seasoned clear broth or consume(fat free) Sugar, honey syrup ___________________________________________________________________      BRUSH YOUR TEETH MORNING OF SURGERY AND RINSE YOUR MOUTH OUT, NO CHEWING GUM CANDY OR MINTS.     Take these medicines the morning of surgery with A SIP OF WATER:  none   DO NOT TAKE ANY DIABETIC MEDICATIONS DAY OF YOUR SURGERY                               You may not have any metal on your body including hair pins and              piercings  Do not wear jewelry, make-up, lotions, powders or  perfumes, deodorant  Do not wear nail polish on your fingernails.  Do not shave  48 hours prior to surgery.              Men may shave face and neck.   Do not bring valuables to the hospital. [place].  Contacts, dentures or bridgework may not be worn into surgery.  Leave suitcase in the car. After surgery it may be brought to your room.     Patients discharged the day of surgery will not be allowed to drive home. IF YOU ARE HAVING SURGERY AND GOING HOME THE SAME DAY, YOU MUST HAVE AN ADULT TO DRIVE YOU HOME AND BE WITH YOU FOR 24 HOURS. YOU MAY GO HOME BY TAXI OR UBER OR ORTHERWISE, BUT AN ADULT MUST ACCOMPANY YOU HOME AND STAY WITH YOU FOR 24 HOURS.  Name and phone number of your driver:  Special Instructions: N/A              Please read over the following fact sheets you were given: _____________________________________________________________________  [facility] - Preparing for Surgery Before surgery, you can play an important role.  Because skin is not sterile, your skin needs to be as free of germs as possible.  You can reduce the number of germs on your skin by washing with CHG (chlorahexidine gluconate) soap before surgery.  CHG is an antiseptic cleaner which kills germs and bonds with the skin to continue killing germs even after washing. Please DO NOT use if you have an allergy to CHG or antibacterial soaps.  If your skin becomes reddened/irritated stop using the CHG and inform your nurse when you arrive at Short Stay. Do not shave (including legs and underarms) for at least 48 hours prior to the first CHG shower.  You may shave your face/neck. Please follow these instructions carefully:  1.  Shower with CHG Soap the night before surgery and the  morning of Surgery.  2.  If you choose to wash your hair, wash your hair first as usual with your  normal  shampoo.  3.  After you shampoo, rinse your hair and body thoroughly  to remove the  shampoo.                           4.  Use CHG as you would any other liquid soap.  You can apply chg directly  to the skin and wash                       Gently with a scrungie or clean washcloth.  5.  Apply the CHG Soap to your body ONLY FROM THE NECK DOWN.   Do not use on face/ open                           Wound or open sores. Avoid contact with eyes, ears mouth and genitals (private parts).                       Wash face,  Genitals (private parts) with your normal soap.             6.  Wash thoroughly, paying special attention to the area where your surgery  will be performed.  7.  Thoroughly rinse your body with  warm water from the neck down.  8.  DO NOT shower/wash with your normal soap after using and rinsing off  the CHG Soap.                9.  Pat yourself dry with a clean towel.            10.  Wear clean pajamas.            11.  Place clean sheets on your bed the night of your first shower and do not  sleep with pets. Day of Surgery : Do not apply any lotions/deodorants the morning of surgery.  Please wear clean clothes to the hospital/surgery center.  FAILURE TO FOLLOW THESE INSTRUCTIONS MAY RESULT IN THE CANCELLATION OF YOUR SURGERY PATIENT SIGNATURE_________________________________  NURSE SIGNATURE__________________________________  ________________________________________________________________________

## 2021-07-20 ENCOUNTER — Other Ambulatory Visit: Payer: Self-pay

## 2021-07-20 ENCOUNTER — Encounter (HOSPITAL_COMMUNITY)
Admission: RE | Admit: 2021-07-20 | Discharge: 2021-07-20 | Disposition: A | Discharge status: Home / Self Care | Payer: 59 | Source: Ambulatory Visit | Attending: Orthopedic Surgery | Admitting: Orthopedic Surgery

## 2021-07-20 ENCOUNTER — Encounter (HOSPITAL_COMMUNITY): Payer: Self-pay

## 2021-07-20 VITALS — BP 138/97 | HR 80 | Temp 98.6°F | Resp 16 | Ht 73.0 in | Wt 198.0 lb

## 2021-07-20 DIAGNOSIS — Z01812 Encounter for preprocedural laboratory examination: Secondary | ICD-10-CM | POA: Diagnosis present

## 2021-07-20 DIAGNOSIS — Z01818 Encounter for other preprocedural examination: Secondary | ICD-10-CM

## 2021-07-20 LAB — COMPREHENSIVE METABOLIC PANEL
ALT: 35 U/L (ref 0–44)
AST: 27 U/L (ref 15–41)
Albumin: 4.2 g/dL (ref 3.5–5.0)
Alkaline Phosphatase: 134 U/L — ABNORMAL HIGH (ref 38–126)
Anion gap: 5 (ref 5–15)
BUN: 12 mg/dL (ref 6–20)
CO2: 24 mmol/L (ref 22–32)
Calcium: 8.8 mg/dL — ABNORMAL LOW (ref 8.9–10.3)
Chloride: 109 mmol/L (ref 98–111)
Creatinine, Ser: 0.94 mg/dL (ref 0.61–1.24)
GFR, Estimated: >60 mL/min (ref >60)
Glucose, Bld: 98 mg/dL (ref 70–99)
Potassium: 4 mmol/L (ref 3.5–5.1)
Sodium: 138 mmol/L (ref 135–145)
Total Bilirubin: 0.7 mg/dL (ref 0.3–1.2)
Total Protein: 8.2 g/dL — ABNORMAL HIGH (ref 6.5–8.1)

## 2021-07-20 LAB — CBC
HCT: 44 % (ref 39.0–52.0)
Hemoglobin: 14.9 g/dL (ref 13.0–17.0)
MCH: 29.9 pg (ref 26.0–34.0)
MCHC: 33.9 g/dL (ref 30.0–36.0)
MCV: 88.2 fL (ref 80.0–100.0)
Platelets: 273 10*3/uL (ref 150–400)
RBC: 4.99 MIL/uL (ref 4.22–5.81)
RDW: 14.2 % (ref 11.5–15.5)
WBC: 6.5 10*3/uL (ref 4.0–10.5)
nRBC: 0 % (ref 0.0–0.2)

## 2021-07-20 LAB — SURGICAL PCR SCREEN
MRSA, PCR: NEGATIVE
Staphylococcus aureus: NEGATIVE

## 2021-07-23 NOTE — H&P (Signed)
TOTAL HIP ADMISSION H&P  Patient is admitted for right total hip arthroplasty.  Subjective:  Chief Complaint: right hip pain  HPI: Chad Hernandez, 50 y.o. male, has a history of pain and functional disability in the right hip(s) due to arthritis and patient has failed non-surgical conservative treatments for greater than 12 weeks to include NSAID's and/or analgesics and activity modification.  Onset of symptoms was gradual starting 2 years ago with gradually worsening course since that time.The patient noted no past surgery on the right hip(s).  Patient currently rates pain in the right hip at 8 out of 10 with activity. Patient has worsening of pain with activity and weight bearing, pain that interfers with activities of daily living, and pain with passive range of motion. Patient has evidence of joint space narrowing by imaging studies. This condition presents safety issues increasing the risk of falls.   There is no current active infection.  Patient Active Problem List   Diagnosis Date Noted   S/P left total hip arthroplasty 02/03/2021   Osteoarthritis of left hip 01/12/2021   Obesity (BMI 30-39.9) 01/12/2021   H/O total ankle replacement, 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   Snoring 11/08/2018   Hypersomnia with sleep apnea 11/08/2018   Excessive daytime sleepiness 11/08/2018   OA (osteoarthritis) of ankle 03/28/2013   Ankle joint instability 03/28/2013   Healthcare maintenance 12/22/2012   Past Medical History:  Diagnosis Date   Arthritis    Closed nondisp fracture of right medial malleolus with routine healing    GERD (gastroesophageal reflux disease)    Sleep apnea    mild no cpap     Past Surgical History:  Procedure Laterality Date   EYE SURGERY     HAND SURGERY     ORIF ANKLE FRACTURE Right 06/19/2020   Procedure: Open Reduction Internal Fixation (ORIF) Right medial malleolus fracture;  Surgeon: Wylene Simmer, MD;  Location: [facility];  Service: Orthopedics;  Laterality:  Right;   TOTAL ANKLE ARTHROPLASTY Right 04/10/2020   Procedure: TOTAL ANKLE ARTHROPLASTY;  Surgeon: Wylene Simmer, MD;  Location: [facility];  Service: Orthopedics;  Laterality: Right;   TOTAL HIP ARTHROPLASTY Left 02/03/2021   Procedure: TOTAL HIP ARTHROPLASTY ANTERIOR APPROACH;  Surgeon: Paralee Cancel, MD;  Location: [facility];  Service: Orthopedics;  Laterality: Left;  51min    No current facility-administered medications for this encounter.   Current Outpatient Medications  Medication Sig Dispense Refill Last Dose   acetaminophen (TYLENOL) 325 MG tablet Take 975 mg by mouth every 6 (six) hours as needed for moderate pain.      oxybutynin (DITROPAN XL) 5 MG 24 hr tablet Take 1 tablet (5 mg total) by mouth at bedtime. (Patient not taking: No sig reported) 30 tablet 2    No Known Allergies  Social History   Tobacco Use   Smoking status: Former    Types: Cigars   Smokeless tobacco: Never   Tobacco comments:    occassionally smokes cigars  Substance Use Topics   Alcohol use: Yes    Alcohol/week: 3.0 standard drinks    Types: 3 Cans of beer per week    Comment: occassional    Family History  Problem Relation Age of Onset   Hyperlipidemia Mother    Hypertension Mother    Breast cancer Mother    Hypertension Brother    Diabetes Neg Hx    Heart attack Neg Hx    Sudden death Neg Hx    Cancer Neg Hx  COPD Neg Hx    Early death Neg Hx    Hearing loss Neg Hx    Heart disease Neg Hx    Kidney disease Neg Hx    Stroke Neg Hx      Review of Systems  Constitutional:  Negative for chills and fever.  Respiratory:  Negative for cough and shortness of breath.   Cardiovascular:  Negative for chest pain.  Gastrointestinal:  Negative for nausea and vomiting.  Musculoskeletal:  Positive for arthralgias.    Objective:  Physical Exam Well nourished and well developed. General: Alert and oriented x3, cooperative and pleasant, no acute distress. Head: normocephalic,  atraumatic, neck supple. Eyes: EOMI.  Musculoskeletal: Right hip exam: Painful and limited right hip flexion internal rotation to 5 degrees with external rotation of 20 degrees  Calves soft and nontender. Motor function intact in LE. Strength 5/5 LE bilaterally. Neuro: Distal pulses 2+. Sensation to light touch intact in LE.  Vital signs in last 24 hours:    Labs:   Estimated body mass index is 26.12 kg/m as calculated from the following:   Height as of 07/20/21: 6\' 1"  (1.854 m).   Weight as of 07/20/21: 89.8 kg.   Imaging Review Plain radiographs demonstrate severe degenerative joint disease of the right hip(s). The bone quality appears to be adequate for age and reported activity level.      Assessment/Plan:  End stage arthritis, right hip(s)  The patient history, physical examination, clinical judgement of the provider and imaging studies are consistent with end stage degenerative joint disease of the right hip(s) and total hip arthroplasty is deemed medically necessary. The treatment options including medical management, injection therapy, arthroscopy and arthroplasty were discussed at length. The risks and benefits of total hip arthroplasty were presented and reviewed. The risks due to aseptic loosening, infection, stiffness, dislocation/subluxation,  thromboembolic complications and other imponderables were discussed.  The patient acknowledged the explanation, agreed to proceed with the plan and consent was signed. Patient is being admitted for inpatient treatment for surgery, pain control, PT, OT, prophylactic antibiotics, VTE prophylaxis, progressive ambulation and ADL's and discharge planning.The patient is planning to be discharged  home.  Therapy Plans: HEP Disposition: Home with wife Planned DVT Prophylaxis: aspirin 81mg  BID DME needed: none PCP: Dr. 13/7/22, clearance received TXA: IV Allergies: NKDA Anesthesia Concerns: none BMI: 30.7 Last HgbA1c: Not  diabetic  Other: - Norco, robaxin, celebrex - Planning on SDD  , PA-C Orthopedic Surgery [facility] [place] [phone]

## 2021-07-24 ENCOUNTER — Other Ambulatory Visit: Payer: Self-pay | Admitting: Orthopedic Surgery

## 2021-07-24 LAB — SARS CORONAVIRUS 2 (TAT 6-24 HRS): SARS Coronavirus 2: NEGATIVE

## 2021-07-27 ENCOUNTER — Encounter (HOSPITAL_COMMUNITY): Payer: Self-pay | Admitting: Orthopedic Surgery

## 2021-07-28 ENCOUNTER — Ambulatory Visit (HOSPITAL_COMMUNITY): Payer: 59

## 2021-07-28 ENCOUNTER — Observation Stay (HOSPITAL_COMMUNITY)
Admission: RE | Admit: 2021-07-28 | Discharge: 2021-07-29 | Disposition: A | Discharge status: Home / Self Care | Payer: 59 | Source: Ambulatory Visit | Attending: Orthopedic Surgery | Admitting: Orthopedic Surgery

## 2021-07-28 ENCOUNTER — Ambulatory Visit (HOSPITAL_COMMUNITY): Payer: 59 | Admitting: Anesthesiology

## 2021-07-28 ENCOUNTER — Encounter (HOSPITAL_COMMUNITY): Payer: Self-pay | Admitting: Orthopedic Surgery

## 2021-07-28 ENCOUNTER — Encounter (HOSPITAL_COMMUNITY)
Admission: RE | Disposition: A | Discharge status: Home / Self Care | Payer: Self-pay | Source: Ambulatory Visit | Attending: Orthopedic Surgery

## 2021-07-28 ENCOUNTER — Observation Stay (HOSPITAL_COMMUNITY): Payer: 59

## 2021-07-28 DIAGNOSIS — Z96642 Presence of left artificial hip joint: Secondary | ICD-10-CM | POA: Diagnosis not present

## 2021-07-28 DIAGNOSIS — Z96661 Presence of right artificial ankle joint: Secondary | ICD-10-CM | POA: Insufficient documentation

## 2021-07-28 DIAGNOSIS — M1611 Unilateral primary osteoarthritis, right hip: Principal | ICD-10-CM | POA: Insufficient documentation

## 2021-07-28 DIAGNOSIS — Z96649 Presence of unspecified artificial hip joint: Secondary | ICD-10-CM

## 2021-07-28 DIAGNOSIS — Z87891 Personal history of nicotine dependence: Secondary | ICD-10-CM | POA: Diagnosis not present

## 2021-07-28 DIAGNOSIS — Z419 Encounter for procedure for purposes other than remedying health state, unspecified: Secondary | ICD-10-CM

## 2021-07-28 DIAGNOSIS — Z96651 Presence of right artificial knee joint: Secondary | ICD-10-CM | POA: Insufficient documentation

## 2021-07-28 DIAGNOSIS — Z96641 Presence of right artificial hip joint: Secondary | ICD-10-CM | POA: Diagnosis not present

## 2021-07-28 DIAGNOSIS — Z471 Aftercare following joint replacement surgery: Secondary | ICD-10-CM | POA: Diagnosis not present

## 2021-07-28 DIAGNOSIS — G4710 Hypersomnia, unspecified: Secondary | ICD-10-CM | POA: Diagnosis not present

## 2021-07-28 DIAGNOSIS — G4730 Sleep apnea, unspecified: Secondary | ICD-10-CM | POA: Diagnosis not present

## 2021-07-28 DIAGNOSIS — K219 Gastro-esophageal reflux disease without esophagitis: Secondary | ICD-10-CM | POA: Diagnosis not present

## 2021-07-28 HISTORY — PX: TOTAL HIP ARTHROPLASTY: SHX124

## 2021-07-28 LAB — TYPE AND SCREEN
ABO/RH(D): B POS
Antibody Screen: NEGATIVE

## 2021-07-28 SURGERY — ARTHROPLASTY, HIP, TOTAL, ANTERIOR APPROACH
Anesthesia: Spinal | Site: Hip | Laterality: Right

## 2021-07-28 MED ORDER — MORPHINE SULFATE (PF) 4 MG/ML IV SOLN
0.5000 mg | INTRAVENOUS | Status: DC | PRN
  Administered 2021-07-28: 1 mg via INTRAVENOUS
  Filled 2021-07-28: qty 1

## 2021-07-28 MED ORDER — FENTANYL CITRATE (PF) 100 MCG/2ML IJ SOLN
INTRAMUSCULAR | Status: DC | PRN
  Administered 2021-07-28 (×2): 25 ug via INTRAVENOUS
  Administered 2021-07-28: 50 ug via INTRAVENOUS

## 2021-07-28 MED ORDER — HYDROCODONE-ACETAMINOPHEN 5-325 MG PO TABS
ORAL_TABLET | ORAL | Status: AC
  Administered 2021-07-28: 2 via ORAL
  Filled 2021-07-28: qty 2

## 2021-07-28 MED ORDER — POLYETHYLENE GLYCOL 3350 17 G PO PACK: 17.0000 g | PACK | Freq: Every day | ORAL | Status: DC | PRN

## 2021-07-28 MED ORDER — LACTATED RINGERS IV SOLN: INTRAVENOUS | Status: DC

## 2021-07-28 MED ORDER — HYDROCODONE-ACETAMINOPHEN 7.5-325 MG PO TABS
1.0000 | ORAL_TABLET | ORAL | Status: DC | PRN
  Administered 2021-07-28: 1 via ORAL
  Administered 2021-07-28 – 2021-07-29 (×3): 2 via ORAL
  Filled 2021-07-28 (×2): qty 2

## 2021-07-28 MED ORDER — HYDROMORPHONE HCL 1 MG/ML IJ SOLN
0.2500 mg | INTRAMUSCULAR | Status: DC | PRN
  Administered 2021-07-28: 0.5 mg via INTRAVENOUS

## 2021-07-28 MED ORDER — PROMETHAZINE HCL 25 MG/ML IJ SOLN: 6.2500 mg | INTRAMUSCULAR | Status: DC | PRN

## 2021-07-28 MED ORDER — HYDROCODONE-ACETAMINOPHEN 7.5-325 MG PO TABS
ORAL_TABLET | ORAL | Status: AC
  Filled 2021-07-28: qty 1

## 2021-07-28 MED ORDER — PROPOFOL 10 MG/ML IV BOLUS
INTRAVENOUS | Status: DC | PRN
  Administered 2021-07-28: 200 mg via INTRAVENOUS

## 2021-07-28 MED ORDER — BUPIVACAINE IN DEXTROSE 0.75-8.25 % IT SOLN
INTRATHECAL | Status: DC | PRN
  Administered 2021-07-28: 1.8 mL via INTRATHECAL

## 2021-07-28 MED ORDER — CEFAZOLIN SODIUM-DEXTROSE 2-4 GM/100ML-% IV SOLN
2.0000 g | INTRAVENOUS | Status: AC
  Administered 2021-07-28: 2 g via INTRAVENOUS
  Filled 2021-07-28: qty 100

## 2021-07-28 MED ORDER — STERILE WATER FOR IRRIGATION IR SOLN
Status: DC | PRN
  Administered 2021-07-28: 2000 mL

## 2021-07-28 MED ORDER — PROPOFOL 500 MG/50ML IV EMUL
INTRAVENOUS | Status: AC
  Filled 2021-07-28: qty 50

## 2021-07-28 MED ORDER — PHENYLEPHRINE 40 MCG/ML (10ML) SYRINGE FOR IV PUSH (FOR BLOOD PRESSURE SUPPORT)
PREFILLED_SYRINGE | INTRAVENOUS | Status: DC | PRN
  Administered 2021-07-28 (×3): 80 ug via INTRAVENOUS

## 2021-07-28 MED ORDER — DEXMEDETOMIDINE (PRECEDEX) IN NS 20 MCG/5ML (4 MCG/ML) IV SYRINGE
PREFILLED_SYRINGE | INTRAVENOUS | Status: DC | PRN
  Administered 2021-07-28: 4 ug via INTRAVENOUS
  Administered 2021-07-28 (×2): 8 ug via INTRAVENOUS

## 2021-07-28 MED ORDER — BISACODYL 10 MG RE SUPP: 10.0000 mg | Freq: Every day | RECTAL | Status: DC | PRN

## 2021-07-28 MED ORDER — METHOCARBAMOL 500 MG PO TABS
500.0000 mg | ORAL_TABLET | Freq: Four times a day (QID) | ORAL | Status: DC | PRN
  Administered 2021-07-28: 500 mg via ORAL

## 2021-07-28 MED ORDER — PHENOL 1.4 % MT LIQD: 1.0000 | OROMUCOSAL | Status: DC | PRN

## 2021-07-28 MED ORDER — TRANEXAMIC ACID-NACL 1000-0.7 MG/100ML-% IV SOLN: 1000.0000 mg | Freq: Once | INTRAVENOUS | Status: DC

## 2021-07-28 MED ORDER — EPHEDRINE SULFATE-NACL 50-0.9 MG/10ML-% IV SOSY
PREFILLED_SYRINGE | INTRAVENOUS | Status: DC | PRN
  Administered 2021-07-28: 10 mg via INTRAVENOUS

## 2021-07-28 MED ORDER — TRANEXAMIC ACID-NACL 1000-0.7 MG/100ML-% IV SOLN
1000.0000 mg | INTRAVENOUS | Status: AC
  Administered 2021-07-28: 1000 mg via INTRAVENOUS
  Filled 2021-07-28: qty 100

## 2021-07-28 MED ORDER — DEXAMETHASONE SODIUM PHOSPHATE 10 MG/ML IJ SOLN
INTRAMUSCULAR | Status: DC | PRN
  Administered 2021-07-28: 8 mg via INTRAVENOUS

## 2021-07-28 MED ORDER — ACETAMINOPHEN 325 MG PO TABS: 325.0000 mg | ORAL_TABLET | Freq: Four times a day (QID) | ORAL | Status: DC | PRN

## 2021-07-28 MED ORDER — DEXAMETHASONE SODIUM PHOSPHATE 10 MG/ML IJ SOLN: 8.0000 mg | Freq: Once | INTRAMUSCULAR | Status: DC

## 2021-07-28 MED ORDER — ASPIRIN 81 MG PO CHEW
81.0000 mg | CHEWABLE_TABLET | Freq: Two times a day (BID) | ORAL | Status: DC
  Administered 2021-07-28 – 2021-07-29 (×2): 81 mg via ORAL
  Filled 2021-07-28 (×2): qty 1

## 2021-07-28 MED ORDER — DEXMEDETOMIDINE (PRECEDEX) IN NS 20 MCG/5ML (4 MCG/ML) IV SYRINGE
PREFILLED_SYRINGE | INTRAVENOUS | Status: AC
  Filled 2021-07-28: qty 5

## 2021-07-28 MED ORDER — CELECOXIB 200 MG PO CAPS
200.0000 mg | ORAL_CAPSULE | Freq: Two times a day (BID) | ORAL | Status: DC
  Administered 2021-07-28 – 2021-07-29 (×2): 200 mg via ORAL
  Filled 2021-07-28 (×2): qty 1

## 2021-07-28 MED ORDER — DOCUSATE SODIUM 100 MG PO CAPS
100.0000 mg | ORAL_CAPSULE | Freq: Two times a day (BID) | ORAL | Status: DC
  Administered 2021-07-28 – 2021-07-29 (×2): 100 mg via ORAL
  Filled 2021-07-28 (×2): qty 1

## 2021-07-28 MED ORDER — HYDROCODONE-ACETAMINOPHEN 5-325 MG PO TABS: 1.0000 | ORAL_TABLET | ORAL | Status: DC | PRN

## 2021-07-28 MED ORDER — DEXAMETHASONE SODIUM PHOSPHATE 10 MG/ML IJ SOLN
10.0000 mg | Freq: Once | INTRAMUSCULAR | Status: AC
  Administered 2021-07-29: 10 mg via INTRAVENOUS
  Filled 2021-07-28: qty 1

## 2021-07-28 MED ORDER — METOCLOPRAMIDE HCL 5 MG/ML IJ SOLN: 5.0000 mg | Freq: Three times a day (TID) | INTRAMUSCULAR | Status: DC | PRN

## 2021-07-28 MED ORDER — FERROUS SULFATE 325 (65 FE) MG PO TABS
325.0000 mg | ORAL_TABLET | Freq: Three times a day (TID) | ORAL | Status: DC
  Administered 2021-07-29: 325 mg via ORAL
  Filled 2021-07-28: qty 1

## 2021-07-28 MED ORDER — PHENYLEPHRINE 40 MCG/ML (10ML) SYRINGE FOR IV PUSH (FOR BLOOD PRESSURE SUPPORT)
PREFILLED_SYRINGE | INTRAVENOUS | Status: AC
  Filled 2021-07-28: qty 10

## 2021-07-28 MED ORDER — CEFAZOLIN SODIUM-DEXTROSE 2-4 GM/100ML-% IV SOLN
2.0000 g | Freq: Four times a day (QID) | INTRAVENOUS | Status: AC
  Administered 2021-07-28: 2 g via INTRAVENOUS
  Filled 2021-07-28: qty 100

## 2021-07-28 MED ORDER — MENTHOL 3 MG MT LOZG: 1.0000 | LOZENGE | OROMUCOSAL | Status: DC | PRN

## 2021-07-28 MED ORDER — MEPERIDINE HCL 50 MG/ML IJ SOLN
6.2500 mg | INTRAMUSCULAR | Status: DC | PRN
Start: 2021-07-28 — End: 2021-07-28

## 2021-07-28 MED ORDER — DIPHENHYDRAMINE HCL 12.5 MG/5ML PO ELIX: 12.5000 mg | ORAL_SOLUTION | ORAL | Status: DC | PRN

## 2021-07-28 MED ORDER — MIDAZOLAM HCL 2 MG/2ML IJ SOLN
INTRAMUSCULAR | Status: AC
  Filled 2021-07-28: qty 2

## 2021-07-28 MED ORDER — PHENYLEPHRINE HCL-NACL 20-0.9 MG/250ML-% IV SOLN
INTRAVENOUS | Status: DC | PRN
  Administered 2021-07-28: 15 ug/min via INTRAVENOUS

## 2021-07-28 MED ORDER — ONDANSETRON HCL 4 MG/2ML IJ SOLN: 4.0000 mg | Freq: Four times a day (QID) | INTRAMUSCULAR | Status: DC | PRN

## 2021-07-28 MED ORDER — 0.9 % SODIUM CHLORIDE (POUR BTL) OPTIME
TOPICAL | Status: DC | PRN
  Administered 2021-07-28: 1000 mL

## 2021-07-28 MED ORDER — POVIDONE-IODINE 10 % EX SWAB
2.0000 "application " | Freq: Once | CUTANEOUS | Status: AC
  Administered 2021-07-28: 2 via TOPICAL

## 2021-07-28 MED ORDER — METOCLOPRAMIDE HCL 5 MG PO TABS
5.0000 mg | ORAL_TABLET | Freq: Three times a day (TID) | ORAL | Status: DC | PRN
  Filled 2021-07-28: qty 2

## 2021-07-28 MED ORDER — MIDAZOLAM HCL 5 MG/5ML IJ SOLN
INTRAMUSCULAR | Status: DC | PRN
  Administered 2021-07-28: 2 mg via INTRAVENOUS

## 2021-07-28 MED ORDER — PROPOFOL 500 MG/50ML IV EMUL
INTRAVENOUS | Status: DC | PRN
  Administered 2021-07-28: 75 ug/kg/min via INTRAVENOUS
  Administered 2021-07-28: 25 ug/kg/min via INTRAVENOUS

## 2021-07-28 MED ORDER — HYDROMORPHONE HCL 1 MG/ML IJ SOLN
INTRAMUSCULAR | Status: AC
  Administered 2021-07-28: 0.5 mg via INTRAVENOUS
  Filled 2021-07-28: qty 1

## 2021-07-28 MED ORDER — SODIUM CHLORIDE 0.9 % IV SOLN: INTRAVENOUS | Status: DC

## 2021-07-28 MED ORDER — ONDANSETRON HCL 4 MG/2ML IJ SOLN
INTRAMUSCULAR | Status: DC | PRN
  Administered 2021-07-28: 4 mg via INTRAVENOUS

## 2021-07-28 MED ORDER — FENTANYL CITRATE (PF) 100 MCG/2ML IJ SOLN
INTRAMUSCULAR | Status: AC
  Filled 2021-07-28: qty 2

## 2021-07-28 MED ORDER — METHOCARBAMOL 500 MG PO TABS
ORAL_TABLET | ORAL | Status: AC
  Filled 2021-07-28: qty 1

## 2021-07-28 MED ORDER — METHOCARBAMOL 500 MG IVPB - SIMPLE MED
500.0000 mg | Freq: Four times a day (QID) | INTRAVENOUS | Status: DC | PRN
  Filled 2021-07-28: qty 50

## 2021-07-28 MED ORDER — ONDANSETRON HCL 4 MG PO TABS
4.0000 mg | ORAL_TABLET | Freq: Four times a day (QID) | ORAL | Status: DC | PRN
  Filled 2021-07-28: qty 1

## 2021-07-28 SURGICAL SUPPLY — 44 items
ADH SKN CLS APL DERMABOND .7 (GAUZE/BANDAGES/DRESSINGS) ×1
BAG COUNTER SPONGE SURGICOUNT (BAG) IMPLANT
BAG DECANTER FOR FLEXI CONT (MISCELLANEOUS) IMPLANT
BAG SPEC THK2 15X12 ZIP CLS (MISCELLANEOUS)
BAG SPNG CNTER NS LX DISP (BAG)
BAG SURGICOUNT SPONGE COUNTING (BAG)
BAG ZIPLOCK 12X15 (MISCELLANEOUS) IMPLANT
BLADE SAG 18X100X1.27 (BLADE) ×3 IMPLANT
COVER PERINEAL POST (MISCELLANEOUS) ×3 IMPLANT
COVER SURGICAL LIGHT HANDLE (MISCELLANEOUS) ×3 IMPLANT
CUP ACET PINNCLE SECTR II 62MM (Hips) IMPLANT
DERMABOND ADVANCED (GAUZE/BANDAGES/DRESSINGS) ×2
DERMABOND ADVANCED .7 DNX12 (GAUZE/BANDAGES/DRESSINGS) ×1 IMPLANT
DRAPE FOOT SWITCH (DRAPES) ×3 IMPLANT
DRAPE STERI IOBAN 125X83 (DRAPES) ×3 IMPLANT
DRAPE U-SHAPE 47X51 STRL (DRAPES) ×6 IMPLANT
DRESSING AQUACEL AG SP 3.5X10 (GAUZE/BANDAGES/DRESSINGS) ×1 IMPLANT
DRSG AQUACEL AG ADV 3.5X10 (GAUZE/BANDAGES/DRESSINGS) ×2 IMPLANT
DRSG AQUACEL AG SP 3.5X10 (GAUZE/BANDAGES/DRESSINGS) ×3
DURAPREP 26ML APPLICATOR (WOUND CARE) ×3 IMPLANT
ELECT REM PT RETURN 15FT ADLT (MISCELLANEOUS) ×3 IMPLANT
ELIMINATOR HOLE APEX DEPUY (Hips) ×2 IMPLANT
GLOVE SURG ENC MOIS LTX SZ6 (GLOVE) ×6 IMPLANT
GLOVE SURG ENC MOIS LTX SZ7 (GLOVE) ×3 IMPLANT
GLOVE SURG UNDER POLY LF SZ7.5 (GLOVE) ×3 IMPLANT
GOWN STRL REUS W/TWL LRG LVL3 (GOWN DISPOSABLE) ×6 IMPLANT
HEAD CERAMIC 36 PLUS5 (Hips) ×2 IMPLANT
HOLDER FOLEY CATH W/STRAP (MISCELLANEOUS) ×3 IMPLANT
KIT TURNOVER KIT A (KITS) IMPLANT
LINER NEUTRAL 62MMC36MM P4 (Liner) ×2 IMPLANT
PACK ANTERIOR HIP CUSTOM (KITS) ×3 IMPLANT
PENCIL SMOKE EVACUATOR (MISCELLANEOUS) IMPLANT
PINNACLE SECTOR II CUP 62MM (Hips) ×3 IMPLANT
SCREW 6.5MMX30MM (Screw) ×2 IMPLANT
STEM FEM ACTIS HIGH SZ8 (Stem) ×2 IMPLANT
SUT MNCRL AB 4-0 PS2 18 (SUTURE) ×3 IMPLANT
SUT STRATAFIX 0 PDS 27 VIOLET (SUTURE) ×3
SUT VIC AB 1 CT1 36 (SUTURE) ×9 IMPLANT
SUT VIC AB 2-0 CT1 27 (SUTURE) ×6
SUT VIC AB 2-0 CT1 TAPERPNT 27 (SUTURE) ×2 IMPLANT
SUTURE STRATFX 0 PDS 27 VIOLET (SUTURE) ×1 IMPLANT
TRAY FOLEY MTR SLVR 16FR STAT (SET/KITS/TRAYS/PACK) IMPLANT
TUBE SUCTION HIGH CAP CLEAR NV (SUCTIONS) ×3 IMPLANT
WATER STERILE IRR 1000ML POUR (IV SOLUTION) ×3 IMPLANT

## 2021-07-28 NOTE — Anesthesia Preprocedure Evaluation (Signed)
Anesthesia Evaluation  Patient identified by MRN, date of birth, ID band Patient awake    Reviewed: Allergy & Precautions, NPO status , Patient's Chart, lab work & pertinent test results  Airway Mallampati: II  TM Distance: >3 FB Neck ROM: Full    Dental no notable dental hx. (+) Dental Advisory Given   Pulmonary neg pulmonary ROS, sleep apnea , Patient abstained from smoking., former smoker,    Pulmonary exam normal breath sounds clear to auscultation       Cardiovascular Exercise Tolerance: Good negative cardio ROS Normal cardiovascular exam Rhythm:Regular Rate:Normal     Neuro/Psych negative neurological ROS  negative psych ROS   GI/Hepatic Neg liver ROS, GERD  ,  Endo/Other  negative endocrine ROS  Renal/GU negative Renal ROS  negative genitourinary   Musculoskeletal negative musculoskeletal ROS (+) Arthritis ,   Abdominal   Peds negative pediatric ROS (+)  Hematology negative hematology ROS (+)   Anesthesia Other Findings   Reproductive/Obstetrics negative OB ROS                             Anesthesia Physical  Anesthesia Plan  ASA: 2  Anesthesia Plan: Spinal   Post-op Pain Management:    Induction: Intravenous  PONV Risk Score and Plan: 2 and Propofol infusion, Ondansetron, TIVA and Treatment may vary due to age or medical condition  Airway Management Planned: Natural Airway and Simple Face Mask  Additional Equipment: None  Intra-op Plan:   Post-operative Plan:   Informed Consent: I have reviewed the patients History and Physical, chart, labs and discussed the procedure including the risks, benefits and alternatives for the proposed anesthesia with the patient or authorized representative who has indicated his/her understanding and acceptance.     Dental advisory given  Plan Discussed with: CRNA  Anesthesia Plan Comments: ( )        Anesthesia Quick  Evaluation

## 2021-07-28 NOTE — Anesthesia Postprocedure Evaluation (Signed)
Anesthesia Post Note  Patient: Chad Hernandez  Procedure(s) Performed: TOTAL HIP ARTHROPLASTY ANTERIOR APPROACH (Right: Hip)     Patient location during evaluation: PACU Anesthesia Type: Spinal Level of consciousness: awake and alert Pain management: pain level controlled Vital Signs Assessment: post-procedure vital signs reviewed and stable Respiratory status: spontaneous breathing Cardiovascular status: stable Anesthetic complications: no   No notable events documented.  Last Vitals:  Vitals:   07/28/21 1600 07/28/21 1615  BP: 124/78 (!) 112/52  Pulse: (!) 55 (!) 59  Resp: (!) 9 12  Temp:  36.6 C  SpO2: 100% 100%    Last Pain:  Vitals:   07/28/21 1530  TempSrc:   PainSc: 2                  Lewie Loron

## 2021-07-28 NOTE — Interval H&P Note (Signed)
History and Physical Interval Note:  07/28/2021 9:58 AM  Chad Hernandez  has presented today for surgery, with the diagnosis of Right hip osteoarthritis.  The various methods of treatment have been discussed with the patient and family. After consideration of risks, benefits and other options for treatment, the patient has consented to  Procedure(s): TOTAL HIP ARTHROPLASTY ANTERIOR APPROACH (Right) as a surgical intervention.  The patient's history has been reviewed, patient examined, no change in status, stable for surgery.  I have reviewed the patient's chart and labs.  Questions were answered to the patient's satisfaction.     [name]

## 2021-07-28 NOTE — Transfer of Care (Signed)
Immediate Anesthesia Transfer of Care Note  Patient: Chad Hernandez  Procedure(s) Performed: Procedure(s): TOTAL HIP ARTHROPLASTY ANTERIOR APPROACH (Right)  Patient Location: PACU  Anesthesia Type:Spinal  Level of Consciousness:  sedated, patient cooperative and responds to stimulation  Airway & Oxygen Therapy:Patient Spontanous Breathing and Patient connected to face mask oxgen  Post-op Assessment:  Report given to PACU RN and Post -op Vital signs reviewed and stable  Post vital signs:  Reviewed and stable  Last Vitals:  Vitals:   07/28/21 0900  BP: (!) 162/91  Pulse: 82  Resp: 15  Temp: 36.9 C  SpO2: 100%    Complications: No apparent anesthesia complications

## 2021-07-28 NOTE — Discharge Instructions (Signed)

## 2021-07-28 NOTE — Plan of Care (Signed)
?  Problem: Education: ?Goal: Knowledge of General Education information will improve ?Description: Including pain rating scale, medication(s)/side effects and non-pharmacologic comfort measures ?Outcome: Progressing ?  ?Problem: Activity: ?Goal: Risk for activity intolerance will decrease ?Outcome: Progressing ?  ?Problem: Nutrition: ?Goal: Adequate nutrition will be maintained ?Outcome: Progressing ?  ?Problem: Elimination: ?Goal: Will not experience complications related to bowel motility ?Outcome: Progressing ?  ?Problem: Pain Managment: ?Goal: General experience of comfort will improve ?Outcome: Progressing ?  ?Problem: Safety: ?Goal: Ability to remain free from injury will improve ?Outcome: Progressing ?  ?Problem: Education: ?Goal: Knowledge of the prescribed therapeutic regimen will improve ?Outcome: Progressing ?  ?Problem: Activity: ?Goal: Ability to avoid complications of mobility impairment will improve ?Outcome: Progressing ?  ?Problem: Pain Management: ?Goal: Pain level will decrease with appropriate interventions ?Outcome: Progressing ?  ?

## 2021-07-28 NOTE — Op Note (Signed)
NAME:  Chad Hernandez                ACCOUNT NO.: [id number]      MEDICAL RECORD NO.: [id number]      FACILITY:  [facility]      PHYSICIAN:  [name]  DATE OF BIRTH:  02/28/1971     DATE OF PROCEDURE:  07/28/2021                                 OPERATIVE REPORT         PREOPERATIVE DIAGNOSIS: Right  hip osteoarthritis.      POSTOPERATIVE DIAGNOSIS:  Right hip osteoarthritis.      PROCEDURE:  Right total hip replacement through an anterior approach   utilizing DePuy THR system, component size 62 mm pinnacle cup, a size 36+4 neutral   Altrex liner, a size 8 Hi Actis stem with a 36+5 delta ceramic   ball.      SURGEON:  Madlyn Frankel. Charlann Boxer, M.D.      ASSISTANT:  Rosalene Billings, PA-C     ANESTHESIA:  Spinal.      SPECIMENS:  None.      COMPLICATIONS:  None.      BLOOD LOSS:  450 cc     DRAINS:  None.      INDICATION OF THE PROCEDURE:  Chad Hernandez is a 50 y.o. male who had   presented to office for evaluation of right hip pain.  Radiographs revealed   progressive degenerative changes with bone-on-bone   articulation of the  hip joint, including subchondral cystic changes and osteophytes.  The patient had painful limited range of   motion significantly affecting their overall quality of life and function.  The patient was failing to    respond to conservative measures including medications and/or injections and activity modification and at this point was ready   to proceed with more definitive measures.  Consent was obtained for   benefit of pain relief.  Specific risks of infection, DVT, component   failure, dislocation, neurovascular injury, and need for revision surgery were reviewed in the office as well discussion of   the anterior versus posterior approach were reviewed.     PROCEDURE IN DETAIL:  The patient was brought to operative theater.   Once adequate anesthesia, preoperative antibiotics, 2 gm of Ancef, 1 gm of Tranexamic Acid, and  10 mg of Decadron were administered, the patient was positioned supine on the [employer] table.  Once the patient was safely positioned with adequate padding of boney prominences we predraped out the hip, and used fluoroscopy to confirm orientation of the pelvis.      The right hip was then prepped and draped from proximal iliac crest to   mid thigh with a shower curtain technique.      Time-out was performed identifying the patient, planned procedure, and the appropriate extremity.     An incision was then made 2 cm lateral to the   anterior superior iliac spine extending over the orientation of the   tensor fascia lata muscle and sharp dissection was carried down to the   fascia of the muscle.      The fascia was then incised.  The muscle belly was identified and swept   laterally and retractor placed along the superior neck.  Following   cauterization of the circumflex vessels and removing some  pericapsular   fat, a second cobra retractor was placed on the inferior neck.  A T-capsulotomy was made along the line of the   superior neck to the trochanteric fossa, then extended proximally and   distally.  Tag sutures were placed and the retractors were then placed   intracapsular.  We then identified the trochanteric fossa and   orientation of my neck cut and then made a neck osteotomy with the femur on traction.  The femoral   head was removed without difficulty or complication.  Traction was let   off and retractors were placed posterior and anterior around the   acetabulum.      The labrum and foveal tissue were debrided.  I began reaming with a 57 mm   reamer and reamed up to 61 mm reamer with good bony bed preparation and a 62 mm  cup was chosen.  The final 62 mm Pinnacle cup was then impacted under fluoroscopy to confirm the depth of penetration and orientation with respect to   Abduction and forward flexion.  A screw was placed into the ilium followed by the hole eliminator.  The  final   36+4 neutral Altrex liner was impacted with good visualized rim fit.  The cup was positioned anatomically within the acetabular portion of the pelvis.      At this point, the femur was rolled to 100 degrees.  Further capsule was   released off the inferior aspect of the femoral neck.  I then   released the superior capsule proximally.  With the leg in a neutral position the hook was placed laterally   along the femur under the vastus lateralis origin and elevated manually and then held in position using the hook attachment on the bed.  The leg was then extended and adducted with the leg rolled to 100   degrees of external rotation.  Retractors were placed along the medial calcar and posteriorly over the greater trochanter.  Once the proximal femur was fully   exposed, I used a box osteotome to set orientation.  I then began   broaching with the starting chili pepper broach and passed this by hand and then broached up to 8.  With the 8 broach in place I chose a high offset neck and did several trial reductions.  The offset was appropriate, leg lengths   appeared to be equal best matched with the +5 head ball trial confirmed radiographically.   Given these findings, I went ahead and dislocated the hip, repositioned all   retractors and positioned the right hip in the extended and abducted position.  The final 8 Hi Actis stem was   chosen and it was impacted down to the level of neck cut.  Based on this   and the trial reductions, a final 36+5 delta ceramic ball was chosen and   impacted onto a clean and dry trunnion, and the hip was reduced.  The   hip had been irrigated throughout the case again at this point.  I did   reapproximate the superior capsular leaflet to the anterior leaflet   using #1 Vicryl.  The fascia of the   tensor fascia lata muscle was then reapproximated using #1 Vicryl and #0 Stratafix sutures.  The   remaining wound was closed with 2-0 Vicryl and running 4-0  Monocryl.   The hip was cleaned, dried, and dressed sterilely using Dermabond and   Aquacel dressing.  The patient was then brought   to  recovery room in stable condition tolerating the procedure well.    Rosalene Billings, PA-C was present for the entirety of the case involved from   preoperative positioning, perioperative retractor management, general   facilitation of the case, as well as primary wound closure as assistant.            Madlyn Frankel Charlann Boxer, M.D.        07/28/2021 11:32 AM

## 2021-07-28 NOTE — Anesthesia Procedure Notes (Addendum)
Spinal  Patient location during procedure: OR Start time: 07/28/2021 11:24 AM End time: 07/28/2021 11:30 AM Reason for block: surgical anesthesia Staffing Performed: anesthesiologist  Anesthesiologist: Nolon Nations, MD Preanesthetic Checklist Completed: patient identified, IV checked, site marked, risks and benefits discussed, surgical consent, monitors and equipment checked, pre-op evaluation and timeout performed Spinal Block Patient position: sitting Prep: DuraPrep and site prepped and draped Patient monitoring: heart rate, blood pressure and continuous pulse ox Approach: right paramedian Location: L3-4 Injection technique: single-shot Needle Needle type: Spinocan  Needle gauge: 25 G Needle length: 9 cm Additional Notes Expiration date of kit checked and confirmed. Patient tolerated procedure well, without complications.

## 2021-07-29 ENCOUNTER — Other Ambulatory Visit: Payer: Self-pay

## 2021-07-29 ENCOUNTER — Encounter (HOSPITAL_COMMUNITY): Payer: Self-pay | Admitting: Orthopedic Surgery

## 2021-07-29 DIAGNOSIS — Z96651 Presence of right artificial knee joint: Secondary | ICD-10-CM | POA: Diagnosis not present

## 2021-07-29 DIAGNOSIS — Z87891 Personal history of nicotine dependence: Secondary | ICD-10-CM | POA: Diagnosis not present

## 2021-07-29 DIAGNOSIS — M1611 Unilateral primary osteoarthritis, right hip: Secondary | ICD-10-CM | POA: Diagnosis not present

## 2021-07-29 DIAGNOSIS — Z96661 Presence of right artificial ankle joint: Secondary | ICD-10-CM | POA: Diagnosis not present

## 2021-07-29 DIAGNOSIS — Z96642 Presence of left artificial hip joint: Secondary | ICD-10-CM | POA: Diagnosis not present

## 2021-07-29 LAB — BASIC METABOLIC PANEL
Anion gap: 7 (ref 5–15)
BUN: 10 mg/dL (ref 6–20)
CO2: 23 mmol/L (ref 22–32)
Calcium: 8.5 mg/dL — ABNORMAL LOW (ref 8.9–10.3)
Chloride: 105 mmol/L (ref 98–111)
Creatinine, Ser: 0.93 mg/dL (ref 0.61–1.24)
GFR, Estimated: >60 mL/min (ref >60)
Glucose, Bld: 163 mg/dL — ABNORMAL HIGH (ref 70–99)
Potassium: 4.2 mmol/L (ref 3.5–5.1)
Sodium: 135 mmol/L (ref 135–145)

## 2021-07-29 LAB — CBC
HCT: 36.6 % — ABNORMAL LOW (ref 39.0–52.0)
Hemoglobin: 12.6 g/dL — ABNORMAL LOW (ref 13.0–17.0)
MCH: 30 pg (ref 26.0–34.0)
MCHC: 34.4 g/dL (ref 30.0–36.0)
MCV: 87.1 fL (ref 80.0–100.0)
Platelets: 235 10*3/uL (ref 150–400)
RBC: 4.2 MIL/uL — ABNORMAL LOW (ref 4.22–5.81)
RDW: 13.6 % (ref 11.5–15.5)
WBC: 8.9 10*3/uL (ref 4.0–10.5)
nRBC: 0 % (ref 0.0–0.2)

## 2021-07-29 MED ORDER — HYDROCODONE-ACETAMINOPHEN 5-325 MG PO TABS
1.0000 | ORAL_TABLET | Freq: Four times a day (QID) | ORAL | 0 refills | Status: DC | PRN
Start: 2021-07-29 — End: 2022-10-01

## 2021-07-29 MED ORDER — CELECOXIB 200 MG PO CAPS
200.0000 mg | ORAL_CAPSULE | Freq: Two times a day (BID) | ORAL | 0 refills | Status: DC
Start: 2021-07-29 — End: 2022-10-01

## 2021-07-29 MED ORDER — DOCUSATE SODIUM 100 MG PO CAPS
100.0000 mg | ORAL_CAPSULE | Freq: Two times a day (BID) | ORAL | 0 refills | Status: DC
Start: 2021-07-29 — End: 2022-10-01

## 2021-07-29 MED ORDER — METHOCARBAMOL 500 MG PO TABS: 500.0000 mg | ORAL_TABLET | Freq: Four times a day (QID) | ORAL | 0 refills | Status: DC | PRN

## 2021-07-29 MED ORDER — POLYETHYLENE GLYCOL 3350 17 G PO PACK: 17.0000 g | PACK | Freq: Every day | ORAL | 0 refills | Status: DC | PRN

## 2021-07-29 MED ORDER — ASPIRIN 81 MG PO CHEW: 81.0000 mg | CHEWABLE_TABLET | Freq: Two times a day (BID) | ORAL | 0 refills | Status: AC

## 2021-07-29 NOTE — Progress Notes (Signed)
   Subjective: 1 Day Post-Op Procedure(s) (LRB): TOTAL HIP ARTHROPLASTY ANTERIOR APPROACH (Right) Patient reports pain as mild.   Patient seen in rounds with Dr. Charlann Boxer. Patient is resting in bed on exam this morning. He is in good spirits. Foley catheter removed. Denies CP, SHOB, N/V.  We will start therapy today.   Objective: Vital signs in last 24 hours: Temp:  [97.4 F (36.3 C)-98.7 F (37.1 C)] 98.4 F (36.9 C) (11/16 0608) Pulse Rate:  [55-88] 83 (11/16 0608) Resp:  [8-20] 16 (11/16 0608) BP: (91-162)/(52-97) 112/73 (11/16 0608) SpO2:  [97 %-100 %] 100 % (11/16 0258)  Intake/Output from previous day:  Intake/Output Summary (Last 24 hours) at 07/29/2021 0749 Last data filed at 07/29/2021 0611 Gross per 24 hour  Intake 4069.91 ml  Output 2600 ml  Net 1469.91 ml     Intake/Output this shift: No intake/output data recorded.  Labs: Recent Labs    07/29/21 0326  HGB 12.6*   Recent Labs    07/29/21 0326  WBC 8.9  RBC 4.20*  HCT 36.6*  PLT 235   Recent Labs    07/29/21 0326  NA 135  K 4.2  CL 105  CO2 23  BUN 10  CREATININE 0.93  GLUCOSE 163*  CALCIUM 8.5*   No results for input(s): LABPT, INR in the last 72 hours.  Exam: General - Patient is Alert and Oriented Extremity - Neurologically intact Sensation intact distally Intact pulses distally Dorsiflexion/Plantar flexion intact Dressing - dressing C/D/I Motor Function - intact, moving foot and toes well on exam.   Past Medical History:  Diagnosis Date   Arthritis    Closed nondisp fracture of right medial malleolus with routine healing    GERD (gastroesophageal reflux disease)    Sleep apnea    mild no cpap     Assessment/Plan: 1 Day Post-Op Procedure(s) (LRB): TOTAL HIP ARTHROPLASTY ANTERIOR APPROACH (Right) Active Problems:   S/P total right hip arthroplasty   Status post THR (total hip replacement)  Estimated body mass index is 26.12 kg/m as calculated from the following:   Height  as of this encounter: 6\' 1"  (1.854 m).   Weight as of 07/20/21: 89.8 kg. Advance diet Up with therapy D/C IV fluids  DVT Prophylaxis - Aspirin Weight bearing as tolerated.  Plan is to go Home after hospital stay. Plan to get up with PT this morning. Discharge home after 1-2 sessions as long as he is meeting his goals. Follow up in the office in 2 weeks.   13/7/22, PA-C Orthopedic Surgery [phone] 07/29/2021, 7:49 AM

## 2021-07-29 NOTE — Evaluation (Signed)
Physical Therapy One Time Evaluation Patient Details Name: Chad Hernandez MRN: [id number] DOB: 08/22/1971 Today's Date: 07/29/2021  History of Present Illness  Pt is a 50 year old male s/p Rt THA direct anterior approach with PMHx significant for L THA, R ankle replacement  Clinical Impression  Patient evaluated by Physical Therapy with no further acute PT needs identified. All education has been completed and the patient has no further questions.  Pt familiar with PT from recent L THA.  Pt ambulated in hallway, practiced steps, and performed LE exercises.  Pt provided with HEP handout and had no further questions.  Pt feels ready for d/c home today.  See below for any follow-up Physical Therapy or equipment needs. PT is signing off. Thank you for this referral.      Recommendations for follow up therapy are one component of a multi-disciplinary discharge planning process, led by the attending physician.  Recommendations may be updated based on patient status, additional functional criteria and insurance authorization.  Follow Up Recommendations Follow physician's recommendations for discharge plan and follow up therapies (plans for HEP)    Assistance Recommended at Discharge    Functional Status Assessment    Equipment Recommendations  None recommended by PT    Recommendations for Other Services       Precautions / Restrictions Precautions Precautions: None Restrictions Weight Bearing Restrictions: No Other Position/Activity Restrictions: WBAT      Mobility  Bed Mobility Overal bed mobility: Modified Independent                  Transfers Overall transfer level: Needs assistance Equipment used: Rolling walker (2 wheels) Transfers: Sit to/from Stand Sit to Stand: Supervision           General transfer comment: cues for technique    Ambulation/Gait Ambulation/Gait assistance: Min guard;Supervision Gait Distance (Feet): 200 Feet Assistive device: Rolling  walker (2 wheels) Gait Pattern/deviations: Step-through pattern;Decreased stride length       General Gait Details: pt steady with RW, good pace  Stairs Stairs: Yes Stairs assistance: Min guard Stair Management: Step to pattern;Forwards;Two rails Number of Stairs: 2 General stair comments: verbal cues for sequence  Wheelchair Mobility    Modified Rankin (Stroke Patients Only)       Balance                                             Pertinent Vitals/Pain Pain Assessment: 0-10 Pain Score: 3  Pain Location: right hip Pain Descriptors / Indicators: Aching;Sore Pain Intervention(s): Repositioned;Monitored during session    Home Living Family/patient expects to be discharged to:: Private residence Living Arrangements: Spouse/significant other Available Help at Discharge: Family Type of Home: House Home Access: Level entry       Home Layout: Two level;Able to live on main level with bedroom/bathroom Home Equipment: Agricultural consultant (2 wheels)      Prior Function Prior Level of Function : Independent/Modified Independent                     Hand Dominance        Extremity/Trunk Assessment        Lower Extremity Assessment Lower Extremity Assessment: RLE deficits/detail RLE Deficits / Details: hip grossly 2+/5 throughout, able to perform ankle pumps       Communication   Communication: No difficulties  Cognition Arousal/Alertness: Awake/alert  Behavior During Therapy: WFL for tasks assessed/performed Overall Cognitive Status: Within Functional Limits for tasks assessed                                          General Comments      Exercises Total Joint Exercises Ankle Circles/Pumps: AROM;Both;10 reps Quad Sets: AROM;Both;10 reps Heel Slides: AAROM;Right;10 reps Hip ABduction/ADduction: AROM;Supine;Standing;Right;10 reps Long Arc Quad: AROM;Right;Seated;10 reps Knee Flexion: AROM;Standing;Right;10  reps Marching in Standing: AROM;Right;10 reps;Standing   Assessment/Plan    PT Assessment Patient does not need any further PT services  PT Problem List         PT Treatment Interventions      PT Goals (Current goals can be found in the Care Plan section)  Acute Rehab PT Goals PT Goal Formulation: All assessment and education complete, DC therapy    Frequency     Barriers to discharge        Co-evaluation               AM-PAC PT "6 Clicks" Mobility  Outcome Measure Help needed turning from your back to your side while in a flat bed without using bedrails?: None Help needed moving from lying on your back to sitting on the side of a flat bed without using bedrails?: None Help needed moving to and from a bed to a chair (including a wheelchair)?: None Help needed standing up from a chair using your arms (e.g., wheelchair or bedside chair)?: A Little Help needed to walk in hospital room?: A Little Help needed climbing 3-5 steps with a railing? : A Little 6 Click Score: 21    End of Session Equipment Utilized During Treatment: Gait belt Activity Tolerance: Patient tolerated treatment well Patient left: in chair;with call bell/phone within reach Nurse Communication: Mobility status PT Visit Diagnosis: Difficulty in walking, not elsewhere classified (R26.2)    Time: 3235-5732 PT Time Calculation (min) (ACUTE ONLY): 23 min   Charges:   PT Evaluation $PT Eval Low Complexity: 1 Low PT Treatments $Therapeutic Exercise: 8-22 mins       Thomasene Mohair PT, DPT Acute Rehabilitation Services Pager: [phone] Office: [phone]   Kati L Payson 07/29/2021, 11:03 AM

## 2021-07-29 NOTE — TOC Transition Note (Signed)
Transition of Care [facility]) - CM/SW Discharge Note  Patient Details  Name: Chad Hernandez MRN: [id number] Date of Birth: 1971-08-27  [facility] CM/SW Contact:  Sherie Don, LCSW Phone Number: 07/29/2021, 9:58 AM  Clinical Narrative: Patient is expected to discharge home after working with PT. CSW met with patient to confirm discharge plan. Patient will discharge home with a home exercise program (HEP). Patient has a rolling walker and elevated toilets at home, so there are no DME needs at this time. TOC signing off.  Final next level of care: Home/Self Care Barriers to Discharge: No Barriers Identified  Patient Goals and CMS Choice Patient states their goals for this hospitalization and ongoing recovery are:: Discharge home with HEP Choice offered to / list presented to : NA  Discharge Plan and Services         DME Arranged: N/A DME Agency: NA  Readmission Risk Interventions No flowsheet data found.

## 2021-07-30 ENCOUNTER — Encounter (HOSPITAL_COMMUNITY): Payer: Self-pay | Admitting: Orthopedic Surgery

## 2021-08-10 NOTE — Discharge Summary (Signed)
Physician Discharge Summary   Patient ID: Chad Hernandez MRN: [id number] DOB/AGE: December 07, 1970 50 y.o.  Admit date: 07/28/2021 Discharge date: 07/29/2021  Primary Diagnosis: Right  hip osteoarthritis.   Admission Diagnoses:  Past Medical History:  Diagnosis Date   Arthritis    Closed nondisp fracture of right medial malleolus with routine healing    GERD (gastroesophageal reflux disease)    Sleep apnea    mild no cpap    Discharge Diagnoses:   Active Problems:   S/P total right hip arthroplasty   Status post THR (total hip replacement)  Estimated body mass index is 26.12 kg/m as calculated from the following:   Height as of this encounter: 6\' 1"  (1.854 m).   Weight as of 07/20/21: 89.8 kg.  Procedure:  Procedure(s) (LRB): TOTAL HIP ARTHROPLASTY ANTERIOR APPROACH (Right)   Consults: None  HPI: Chad Hernandez is a 50 y.o. male who had   presented to office for evaluation of right hip pain.  Radiographs revealed   progressive degenerative changes with bone-on-bone   articulation of the  hip joint, including subchondral cystic changes and osteophytes.  The patient had painful limited range of   motion significantly affecting their overall quality of life and function.  The patient was failing to    respond to conservative measures including medications and/or injections and activity modification and at this point was ready   to proceed with more definitive measures.  Consent was obtained for   benefit of pain relief.  Specific risks of infection, DVT, component   failure, dislocation, neurovascular injury, and need for revision surgery were reviewed in the office as well discussion of   the anterior versus posterior approach were reviewed.  Laboratory Data: Admission on 07/28/2021, Discharged on 07/29/2021  Component Date Value Ref Range Status   WBC 07/29/2021 8.9  4.0 - 10.5 K/uL Final   RBC 07/29/2021 4.20 (L)  4.22 - 5.81 MIL/uL Final   Hemoglobin 07/29/2021 12.6  (L)  13.0 - 17.0 g/dL Final   HCT 07/31/2021 36.6 (L)  39.0 - 52.0 % Final   MCV 07/29/2021 87.1  80.0 - 100.0 fL Final   MCH 07/29/2021 30.0  26.0 - 34.0 pg Final   MCHC 07/29/2021 34.4  30.0 - 36.0 g/dL Final   RDW 07/31/2021 13.6  11.5 - 15.5 % Final   Platelets 07/29/2021 235  150 - 400 K/uL Final   nRBC 07/29/2021 0.0  0.0 - 0.2 % Final   Performed at [facility], [address], [place], [place]   Sodium 07/29/2021 135  135 - 145 mmol/L Final   Potassium 07/29/2021 4.2  3.5 - 5.1 mmol/L Final   Chloride 07/29/2021 105  98 - 111 mmol/L Final   CO2 07/29/2021 23  22 - 32 mmol/L Final   Glucose, Bld 07/29/2021 163 (H)  70 - 99 mg/dL Final   Glucose reference range applies only to samples taken after fasting for at least 8 hours.   BUN 07/29/2021 10  6 - 20 mg/dL Final   Creatinine, Ser 07/29/2021 0.93  0.61 - 1.24 mg/dL Final   Calcium 07/31/2021 8.5 (L)  8.9 - 10.3 mg/dL Final   GFR, Estimated 07/29/2021 >60  >60 mL/min Final   Comment: (NOTE) Calculated using the CKD-EPI Creatinine Equation (2021)    Anion gap 07/29/2021 7  5 - 15 Final   Performed at [facility], [address], [place], [place]  Orders Only on  07/24/2021  Component Date Value Ref Range Status   SARS Coronavirus 2 07/24/2021 RESULT: NEGATIVE   Final   Comment: RESULT: NEGATIVESARS-CoV-2 INTERPRETATION:A NEGATIVE  test result means that SARS-CoV-2 RNA was not present in the specimen above the limit of detection of this test. This does not preclude a possible SARS-CoV-2 infection and should not be used as the  sole basis for patient management decisions. Negative results must be combined with clinical observations, patient history, and epidemiological information. Optimum specimen types and timing for peak viral levels during infections caused by SARS-CoV-2  have not been determined. Collection of multiple specimens or types of specimens may be necessary to  detect virus. Improper specimen collection and handling, sequence variability under primers/probes, or organism present below the limit of detection may  lead to false negative results. Positive and negative predictive values of testing are highly dependent on prevalence. False negative test results are more likely when prevalence of disease is high.The expected result is NEGATIVE.Fact S                          heet for  Healthcare Providers: [employer] Sheet for Patients: https://poole-freeman.org/ Reference Range - Negative   Hospital Outpatient Visit on 07/20/2021  Component Date Value Ref Range Status   MRSA, PCR 07/20/2021 NEGATIVE  NEGATIVE Final   Staphylococcus aureus 07/20/2021 NEGATIVE  NEGATIVE Final   Comment: (NOTE) The Xpert SA Assay (FDA approved for NASAL specimens in patients 42 years of age and older), is one component of a comprehensive surveillance program. It is not intended to diagnose infection nor to guide or monitor treatment. Performed at [facility], [address] [place], [place] [address]    WBC 07/20/2021 6.5  4.0 - 10.5 K/uL Final   RBC 07/20/2021 4.99  4.22 - 5.81 MIL/uL Final   Hemoglobin 07/20/2021 14.9  13.0 - 17.0 g/dL Final   HCT 80/99/8338 44.0  39.0 - 52.0 % Final   MCV 07/20/2021 88.2  80.0 - 100.0 fL Final   MCH 07/20/2021 29.9  26.0 - 34.0 pg Final   MCHC 07/20/2021 33.9  30.0 - 36.0 g/dL Final   RDW 25/01/3975 14.2  11.5 - 15.5 % Final   Platelets 07/20/2021 273  150 - 400 K/uL Final   nRBC 07/20/2021 0.0  0.0 - 0.2 % Final   Performed at [facility], [address], [place], [place] [address]   Sodium 07/20/2021 138  135 - 145 mmol/L Final   Potassium 07/20/2021 4.0  3.5 - 5.1 mmol/L Final   Chloride 07/20/2021 109  98 - 111 mmol/L Final   CO2 07/20/2021 24  22 - 32 mmol/L Final   Glucose, Bld 07/20/2021 98  70 - 99 mg/dL Final   Glucose  reference range applies only to samples taken after fasting for at least 8 hours.   BUN 07/20/2021 12  6 - 20 mg/dL Final   Creatinine, Ser 07/20/2021 0.94  0.61 - 1.24 mg/dL Final   Calcium 37/90/2409 8.8 (L)  8.9 - 10.3 mg/dL Final   Total Protein 73/53/2992 8.2 (H)  6.5 - 8.1 g/dL Final   Albumin 42/68/3419 4.2  3.5 - 5.0 g/dL Final   AST 62/22/9798 27  15 - 41 U/L Final   ALT 07/20/2021 35  0 - 44 U/L Final   Alkaline Phosphatase 07/20/2021 134 (H)  38 - 126 U/L Final   Total Bilirubin 07/20/2021 0.7  0.3 - 1.2  mg/dL Final   GFR, Estimated 07/20/2021 >60  >60 mL/min Final   Comment: (NOTE) Calculated using the CKD-EPI Creatinine Equation (2021)    Anion gap 07/20/2021 5  5 - 15 Final   Performed at [facility], [address], [place], [place] [address]   ABO/RH(D) 07/20/2021 B POS   Final   Antibody Screen 07/20/2021 NEG   Final   Sample Expiration 07/20/2021 07/31/2021,2359   Final   Extend sample reason 07/20/2021    Final                   Value:NO TRANSFUSIONS OR PREGNANCY IN THE PAST 3 MONTHS Performed at [facility], [address] [place], [place] [address]      X-Rays:DG Pelvis Portable  Result Date: 07/28/2021 CLINICAL DATA:  Right hip replacement. EXAM: PORTABLE PELVIS 1-2 VIEWS COMPARISON:  Pelvis x-ray 02/03/2021. FINDINGS: There is a new right hip total arthroplasty in anatomic alignment. There is no acute fracture. There is right hip soft tissue swelling and air compatible with recent surgery. Left hip arthroplasty is also in anatomic alignment. IMPRESSION: 1. New right hip arthroplasty in anatomic alignment. Electronically Signed   By: Darliss Cheney M.D.   On: 07/28/2021 15:15   DG C-Arm 1-60 Min-No Report  Result Date: 07/28/2021 Fluoroscopy was utilized by the requesting physician.  No radiographic interpretation.   DG C-Arm 1-60 Min-No Report  Result Date: 07/28/2021 Fluoroscopy was utilized by the requesting  physician.  No radiographic interpretation.   DG HIP UNILAT WITH PELVIS 1V RIGHT  Result Date: 07/28/2021 CLINICAL DATA:  Right arthroplasty. EXAM: DG HIP (WITH OR WITHOUT PELVIS) 1V RIGHT COMPARISON:  None. FINDINGS: Two intraoperative spot images show bipolar right hip prosthesis in appropriate position. No evidence of fracture. The femoral head component of the prosthesis is not in place, however both the femoral and acetabular components of the prosthesis are normally aligned. IMPRESSION: Bipolar right hip prosthesis components in appropriate position. Electronically Signed   By: Danae Orleans M.D.   On: 07/28/2021 12:53    EKG:No orders found for this or any previous visit.   Hospital Course: Chad Hernandez is a 50 y.o. who was admitted to [facility]. They were brought to the operating room on 07/28/2021 and underwent Procedure(s): TOTAL HIP ARTHROPLASTY ANTERIOR APPROACH.  Patient tolerated the procedure well and was later transferred to the recovery room and then to the orthopaedic floor for postoperative care. They were given PO and IV analgesics for pain control following their surgery. They were given 24 hours of postoperative antibiotics of  Anti-infectives (From admission, onward)    Start     Dose/Rate Route Frequency Ordered Stop   07/28/21 1730  ceFAZolin (ANCEF) IVPB 2g/100 mL premix        2 g 200 mL/hr over 30 Minutes Intravenous Every 6 hours 07/28/21 1546 07/29/21 0529   07/28/21 0900  ceFAZolin (ANCEF) IVPB 2g/100 mL premix        2 g 200 mL/hr over 30 Minutes Intravenous On call to O.R. 07/28/21 0850 07/28/21 1129      and started on DVT prophylaxis in the form of Aspirin.   PT and OT were ordered for total joint protocol. Discharge planning consulted to help with postop disposition and equipment needs.  Patient had a good night on the evening of surgery. They started to get up OOB with therapy on POD #1. Pt was seen during rounds and was ready to  go home  pending progress with therapy. He worked with therapy on POD #1 and was meeting his goals. Pt was discharged to home later that day in stable condition.  Diet: Regular diet Activity: WBAT Follow-up: in 2 weeks Disposition: Home Discharged Condition: good   Discharge Instructions     Call MD / Call 911   Complete by: As directed    If you experience chest pain or shortness of breath, CALL 911 and be transported to the hospital emergency room.  If you develope a fever above 101 F, pus (white drainage) or increased drainage or redness at the wound, or calf pain, call your surgeon's office.   Change dressing   Complete by: As directed    Maintain surgical dressing until follow up in the clinic. If the edges start to pull up, may reinforce with tape. If the dressing is no longer working, may remove and cover with gauze and tape, but must keep the area dry and clean.  Call with any questions or concerns.   Constipation Prevention   Complete by: As directed    Drink plenty of fluids.  Prune juice may be helpful.  You may use a stool softener, such as Colace (over the counter) 100 mg twice a day.  Use MiraLax (over the counter) for constipation as needed.   Diet - low sodium heart healthy   Complete by: As directed    Increase activity slowly as tolerated   Complete by: As directed    Weight bearing as tolerated with assist device (walker, cane, etc) as directed, use it as long as suggested by your surgeon or therapist, typically at least 4-6 weeks.   Post-operative opioid taper instructions:   Complete by: As directed    POST-OPERATIVE OPIOID TAPER INSTRUCTIONS: It is important to wean off of your opioid medication as soon as possible. If you do not need pain medication after your surgery it is ok to stop day one. Opioids include: Codeine, Hydrocodone(Norco, Vicodin), Oxycodone(Percocet, oxycontin) and hydromorphone amongst others.  Long term and even short term use of opiods can  cause: Increased pain response Dependence Constipation Depression Respiratory depression And more.  Withdrawal symptoms can include Flu like symptoms Nausea, vomiting And more Techniques to manage these symptoms Hydrate well Eat regular healthy meals Stay active Use relaxation techniques(deep breathing, meditating, yoga) Do Not substitute Alcohol to help with tapering If you have been on opioids for less than two weeks and do not have pain than it is ok to stop all together.  Plan to wean off of opioids This plan should start within one week post op of your joint replacement. Maintain the same interval or time between taking each dose and first decrease the dose.  Cut the total daily intake of opioids by one tablet each day Next start to increase the time between doses. The last dose that should be eliminated is the evening dose.      TED hose   Complete by: As directed    Use stockings (TED hose) for 2 weeks on both leg(s).  You may remove them at night for sleeping.      Allergies as of 07/29/2021   No Known Allergies      Medication List     STOP taking these medications    acetaminophen 325 MG tablet Commonly known as: TYLENOL       TAKE these medications    aspirin 81 MG chewable tablet Chew 1 tablet (81 mg total) by mouth  2 (two) times daily for 28 days.   celecoxib 200 MG capsule Commonly known as: CELEBREX Take 1 capsule (200 mg total) by mouth 2 (two) times daily.   docusate sodium 100 MG capsule Commonly known as: COLACE Take 1 capsule (100 mg total) by mouth 2 (two) times daily.   HYDROcodone-acetaminophen 5-325 MG tablet Commonly known as: NORCO/VICODIN Take 1-2 tablets by mouth every 6 (six) hours as needed for severe pain.   methocarbamol 500 MG tablet Commonly known as: ROBAXIN Take 1 tablet (500 mg total) by mouth every 6 (six) hours as needed for muscle spasms.   oxybutynin 5 MG 24 hr tablet Commonly known as: Ditropan XL Take 1  tablet (5 mg total) by mouth at bedtime.   polyethylene glycol 17 g packet Commonly known as: MIRALAX / GLYCOLAX Take 17 g by mouth daily as needed for mild constipation.               Discharge Care Instructions  (From admission, onward)           Start     Ordered   07/29/21 0000  Change dressing       Comments: Maintain surgical dressing until follow up in the clinic. If the edges start to pull up, may reinforce with tape. If the dressing is no longer working, may remove and cover with gauze and tape, but must keep the area dry and clean.  Call with any questions or concerns.   07/29/21 0749            Follow-up Information     Durene Romans, MD. Schedule an appointment as soon as possible for a visit in 2 week(s).   Specialty: Orthopedic Surgery Contact information: [address] [place] [address] [phone]                 Signed: Dennie Bible, PA-C Orthopedic Surgery 08/10/2021, 2:27 PM

## 2022-06-24 IMAGING — RF DG ANKLE COMPLETE 3+V*R*
1 series · 3 of 3 positions shown · non-contrast
Comparison: September 18, 2018.

CLINICAL DATA: Ankle arthroplasty

EXAM:
RIGHT ANKLE - COMPLETE 3+ VIEW; DG C-ARM 1-60 MIN

[Series 1: run · 3 of 3 slices shown]
[im 1/3]
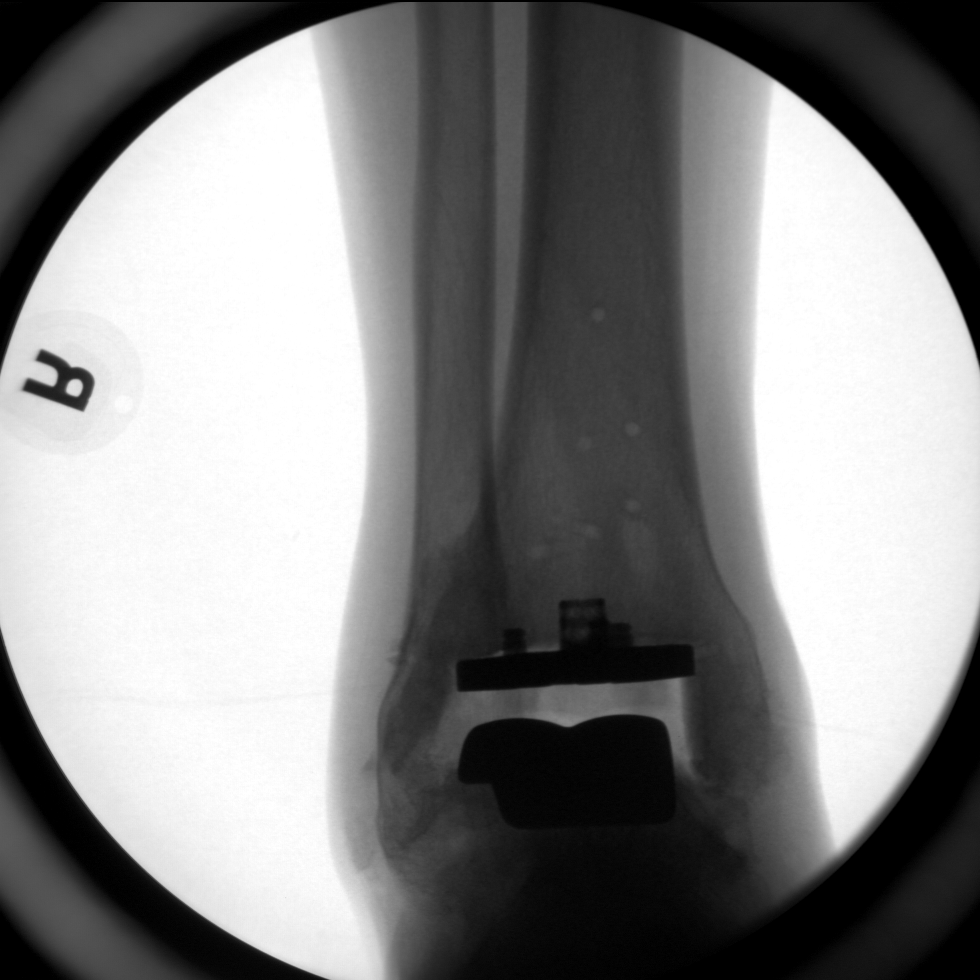
[im 2/3]
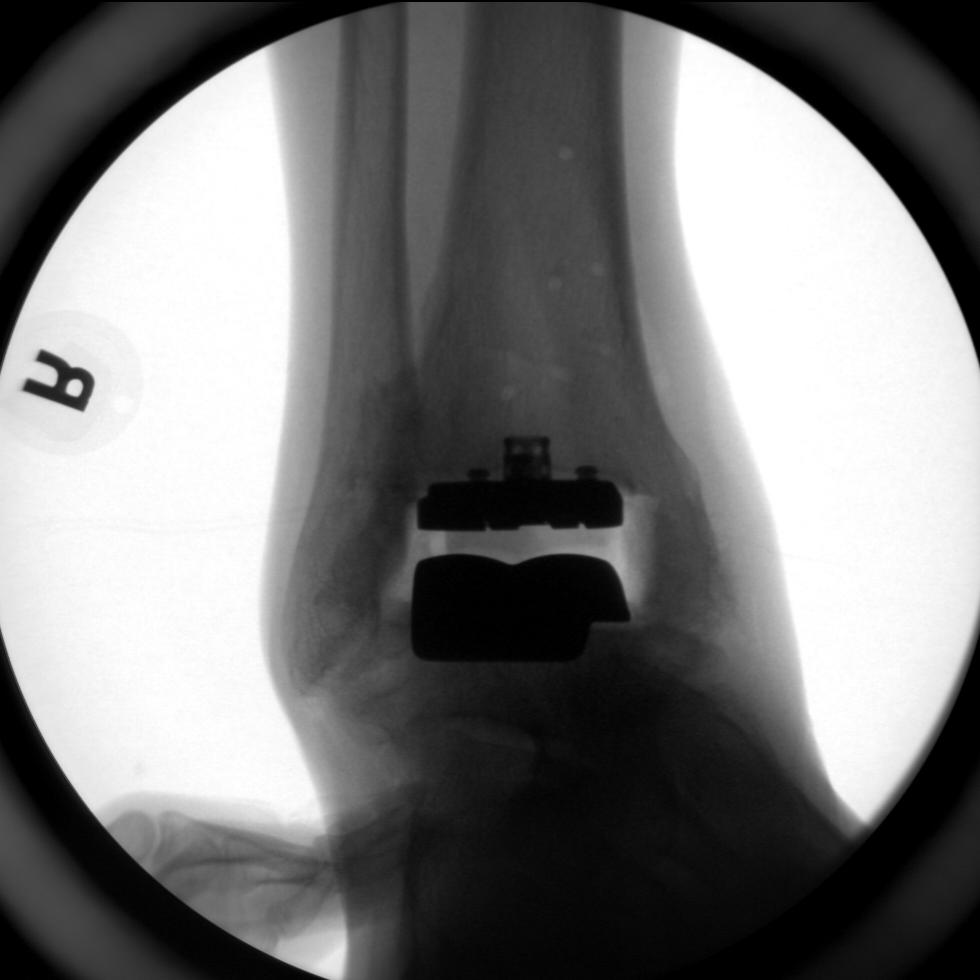
[im 3/3]
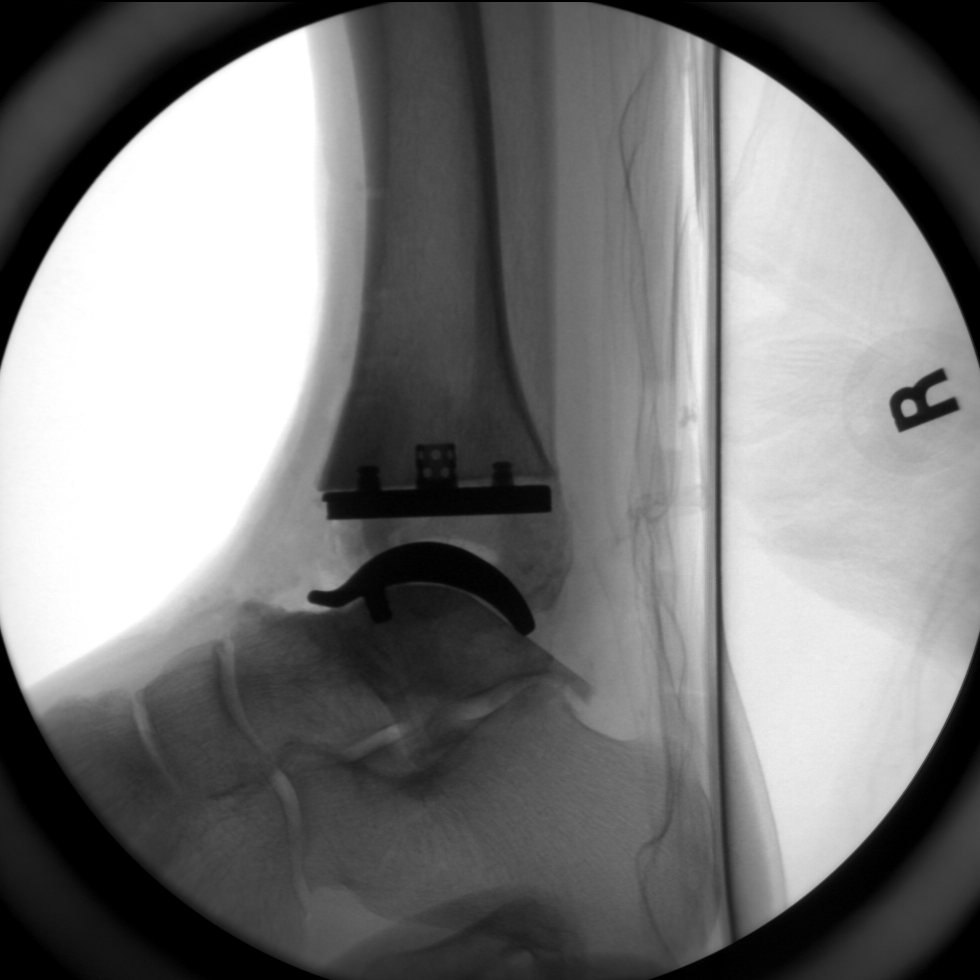

[3 of 3 positions shown; findings below may reference images not displayed]

FINDINGS: Spot fluoroscopy images were obtained for surgical planning
purposes. Patient is status post ankle arthroplasty. Degenerative
changes of the midfoot.
IMPRESSION: Spot fluoroscopy images were obtained for surgical planning
purposes.

## 2022-09-28 ENCOUNTER — Encounter: Payer: 59 | Admitting: Family Medicine

## 2022-10-01 ENCOUNTER — Ambulatory Visit (INDEPENDENT_AMBULATORY_CARE_PROVIDER_SITE_OTHER): Payer: No Typology Code available for payment source | Admitting: Family Medicine

## 2022-10-01 ENCOUNTER — Encounter: Payer: Self-pay | Admitting: Family Medicine

## 2022-10-01 VITALS — BP 142/86 | HR 77 | Temp 97.9°F | Ht 73.0 in | Wt 232.0 lb

## 2022-10-01 DIAGNOSIS — K219 Gastro-esophageal reflux disease without esophagitis: Secondary | ICD-10-CM | POA: Insufficient documentation

## 2022-10-01 DIAGNOSIS — R030 Elevated blood-pressure reading, without diagnosis of hypertension: Secondary | ICD-10-CM

## 2022-10-01 DIAGNOSIS — R7309 Other abnormal glucose: Secondary | ICD-10-CM

## 2022-10-01 DIAGNOSIS — M25512 Pain in left shoulder: Secondary | ICD-10-CM | POA: Diagnosis not present

## 2022-10-01 DIAGNOSIS — Z1159 Encounter for screening for other viral diseases: Secondary | ICD-10-CM

## 2022-10-01 DIAGNOSIS — Z114 Encounter for screening for human immunodeficiency virus [HIV]: Secondary | ICD-10-CM

## 2022-10-01 DIAGNOSIS — Z0000 Encounter for general adult medical examination without abnormal findings: Secondary | ICD-10-CM | POA: Diagnosis not present

## 2022-10-01 DIAGNOSIS — R69 Illness, unspecified: Secondary | ICD-10-CM | POA: Diagnosis not present

## 2022-10-01 DIAGNOSIS — Z23 Encounter for immunization: Secondary | ICD-10-CM | POA: Diagnosis not present

## 2022-10-01 NOTE — Progress Notes (Signed)
Established Patient Office Visit   Subjective:  Patient ID: Chad Hernandez, male    DOB: 11-Jan-1971  Age: 52 y.o. MRN: [id number]  Chief Complaint  Patient presents with   Annual Exam    CPE, check left shoulder little painful x 1 year. Patient fasting.     HPI Encounter Diagnoses  Name Primary?   Left shoulder pain, unspecified chronicity Yes   Healthcare maintenance    Gastroesophageal reflux disease, unspecified whether esophagitis present    Elevated glucose    Encounter for hepatitis C screening test for low risk patient    Screening for HIV (human immunodeficiency virus)    Immunization due    Elevated BP without diagnosis of hypertension    Here for physical exam.  He is 6 hours fasting.  He is having regular dental care.  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  He never sits down.  Things are well at home and at work.  He is due for colonoscopy and would like to be referred.  He is having left shoulder pain.  He has been injury.  He is right-hand dominant.  He experiences reflux from time to time.  He is currently taking no medications for this.   Review of Systems  Constitutional: Negative.   HENT: Negative.    Eyes:  Negative for blurred vision, discharge and redness.  Respiratory: Negative.    Cardiovascular: Negative.   Gastrointestinal:  Negative for abdominal pain.  Genitourinary: Negative.   Musculoskeletal:  Positive for joint pain. Negative for myalgias.  Skin:  Negative for rash.  Neurological:  Negative for tingling, loss of consciousness and weakness.  Endo/Heme/Allergies:  Negative for polydipsia.    No current outpatient medications on file.   Objective:     BP (!) 142/86 (BP Location: Right Arm, Patient Position: Sitting, Cuff Size: Large)   Pulse 77   Temp 97.9 F (36.6 C) (Temporal)   Ht 6\' 1"  (1.854 m)   Wt 232 lb (105.2 kg)   SpO2 97%   BMI 30.61 kg/m  BP Readings from Last 3 Encounters:  10/01/22 (!) 142/86   07/29/21 125/75  07/20/21 (!) 138/97   Wt Readings from Last 3 Encounters:  10/01/22 232 lb (105.2 kg)  07/20/21 198 lb (89.8 kg)  02/03/21 213 lb (96.6 kg)      Physical Exam Constitutional:      General: He is not in acute distress.    Appearance: Normal appearance. He is not ill-appearing, toxic-appearing or diaphoretic.  HENT:     Head: Normocephalic and atraumatic.     Right Ear: External ear normal.     Left Ear: External ear normal.     Mouth/Throat:     Mouth: Mucous membranes are moist.     Pharynx: Oropharynx is clear. No oropharyngeal exudate or posterior oropharyngeal erythema.  Eyes:     General: No scleral icterus.       Right eye: No discharge.        Left eye: No discharge.     Extraocular Movements: Extraocular movements intact.     Conjunctiva/sclera: Conjunctivae normal.     Pupils: Pupils are equal, round, and reactive to light.  Cardiovascular:     Rate and Rhythm: Normal rate and regular rhythm.  Pulmonary:     Effort: Pulmonary effort is normal. No respiratory distress.     Breath sounds: Normal breath sounds.  Abdominal:     General: Bowel sounds are normal.  There is no distension.     Palpations: There is no mass.     Tenderness: There is no abdominal tenderness. There is no guarding.     Hernia: No hernia is present. There is no hernia in the left inguinal area or right inguinal area.  Genitourinary:    Penis: No hypospadias, erythema, tenderness, discharge, swelling or lesions.      Testes:        Right: Mass, tenderness or swelling not present. Right testis is descended.        Left: Mass, tenderness or swelling not present. Left testis is descended.     Epididymis:     Right: Not inflamed or enlarged.     Left: Not inflamed or enlarged.     Prostate: Enlarged. Not tender and no nodules present.     Rectum: Guaiac result negative. No mass, tenderness, anal fissure, external hemorrhoid or internal hemorrhoid. Normal anal tone.   Musculoskeletal:     Left shoulder: Tenderness present. No bony tenderness. Decreased range of motion. Normal strength.       Arms:     Cervical back: No rigidity or tenderness.  Lymphadenopathy:     Lower Body: No right inguinal adenopathy. No left inguinal adenopathy.  Skin:    General: Skin is warm and dry.  Neurological:     Mental Status: He is alert and oriented to person, place, and time.  Psychiatric:        Mood and Affect: Mood normal.        Behavior: Behavior normal.      No results found for any visits on 10/01/22.    The 10-year ASCVD risk score (Arnett DK, et al., 2019) is: 8%    Assessment & Plan:   Left shoulder pain, unspecified chronicity -     Ambulatory referral to [facility] maintenance -     CBC -     Comprehensive metabolic panel -     Lipid panel -     PSA -     Urinalysis, Routine w reflex microscopic -     Ambulatory referral to Gastroenterology  Gastroesophageal reflux disease, unspecified whether esophagitis present  Elevated glucose -     Hemoglobin A1c  Encounter for hepatitis C screening test for low risk patient -     Hepatitis C antibody  Screening for HIV (human immunodeficiency virus) -     HIV Antibody (routine testing w rflx)  Immunization due -     Tdap vaccine greater than or equal to 7yo IM  Elevated BP without diagnosis of hypertension    Return in about 3 months (around 12/31/2022), or Check and record blood pressures periodically.  Try to lose weight..  Information was given on preventing hypertension.  Information given on reflux.  Can use Tums as needed and try to avoid foods that worsen symptoms.  Advised that weight loss could help both of these issues.  Sports medicine referral for left shoulder pain.  Information was given about health maintenance and disease prevention.  Libby Maw, MD

## 2022-10-09 LAB — URINALYSIS, ROUTINE W REFLEX MICROSCOPIC
Bilirubin, UA: NEGATIVE
Glucose, UA: NEGATIVE
Ketones, UA: NEGATIVE
Nitrite, UA: NEGATIVE
Protein,UA: NEGATIVE
RBC, UA: NEGATIVE
Specific Gravity, UA: 1.021 (ref 1.005–1.030)
Urobilinogen, Ur: 1 mg/dL (ref 0.2–1.0)
pH, UA: 6 (ref 5.0–7.5)

## 2022-10-09 LAB — CBC
Hematocrit: 46.6 % (ref 37.5–51.0)
Hemoglobin: 15.8 g/dL (ref 13.0–17.7)
MCH: 31.3 pg (ref 26.6–33.0)
MCHC: 33.9 g/dL (ref 31.5–35.7)
MCV: 92 fL (ref 79–97)
Platelets: 227 10*3/uL (ref 150–450)
RBC: 5.05 x10E6/uL (ref 4.14–5.80)
RDW: 12.7 % (ref 11.6–15.4)
WBC: 7.1 10*3/uL (ref 3.4–10.8)

## 2022-10-09 LAB — LIPID PANEL
Chol/HDL Ratio: 3.4 ratio (ref 0.0–5.0)
Cholesterol, Total: 130 mg/dL (ref 100–199)
HDL: 38 mg/dL — ABNORMAL LOW (ref >39)
LDL Chol Calc (NIH): 81 mg/dL (ref 0–99)
Triglycerides: 48 mg/dL (ref 0–149)
VLDL Cholesterol Cal: 11 mg/dL (ref 5–40)

## 2022-10-09 LAB — HEPATITIS C ANTIBODY: Hep C Virus Ab: NONREACTIVE

## 2022-10-09 LAB — HEMOGLOBIN A1C
Est. average glucose Bld gHb Est-mCnc: 131 mg/dL
Hgb A1c MFr Bld: 6.2 % — ABNORMAL HIGH (ref 4.8–5.6)

## 2022-10-09 LAB — COMPREHENSIVE METABOLIC PANEL
ALT: 24 IU/L (ref 0–44)
AST: 26 IU/L (ref 0–40)
Albumin/Globulin Ratio: 1.5 (ref 1.2–2.2)
Albumin: 4.5 g/dL (ref 3.8–4.9)
Alkaline Phosphatase: 118 IU/L (ref 44–121)
BUN/Creatinine Ratio: 12 (ref 9–20)
BUN: 12 mg/dL (ref 6–24)
Bilirubin Total: 0.5 mg/dL (ref 0.0–1.2)
CO2: 21 mmol/L (ref 20–29)
Calcium: 9.4 mg/dL (ref 8.7–10.2)
Chloride: 106 mmol/L (ref 96–106)
Creatinine, Ser: 0.99 mg/dL (ref 0.76–1.27)
Globulin, Total: 3.1 g/dL (ref 1.5–4.5)
Glucose: 91 mg/dL (ref 70–99)
Potassium: 5.4 mmol/L — ABNORMAL HIGH (ref 3.5–5.2)
Sodium: 143 mmol/L (ref 134–144)
Total Protein: 7.6 g/dL (ref 6.0–8.5)
eGFR: 91 mL/min/{1.73_m2} (ref >59)

## 2022-10-09 LAB — MICROSCOPIC EXAMINATION
Bacteria, UA: NONE SEEN
Casts: NONE SEEN /lpf
RBC, Urine: NONE SEEN /hpf (ref 0–2)

## 2022-10-09 LAB — PSA: Prostate Specific Ag, Serum: 1.2 ng/mL (ref 0.0–4.0)

## 2022-10-09 LAB — HIV ANTIBODY (ROUTINE TESTING W REFLEX)

## 2022-10-14 ENCOUNTER — Ambulatory Visit: Payer: No Typology Code available for payment source | Admitting: Family Medicine

## 2022-10-14 ENCOUNTER — Encounter: Payer: Self-pay | Admitting: Family Medicine

## 2022-10-14 VITALS — BP 136/82 | HR 77 | Temp 98.6°F | Ht 73.0 in | Wt 234.0 lb

## 2022-10-14 DIAGNOSIS — R7303 Prediabetes: Secondary | ICD-10-CM | POA: Insufficient documentation

## 2022-10-14 DIAGNOSIS — Z114 Encounter for screening for human immunodeficiency virus [HIV]: Secondary | ICD-10-CM

## 2022-10-14 DIAGNOSIS — R69 Illness, unspecified: Secondary | ICD-10-CM | POA: Diagnosis not present

## 2022-10-14 DIAGNOSIS — Z96661 Presence of right artificial ankle joint: Secondary | ICD-10-CM

## 2022-10-14 DIAGNOSIS — E876 Hypokalemia: Secondary | ICD-10-CM | POA: Insufficient documentation

## 2022-10-14 DIAGNOSIS — E875 Hyperkalemia: Secondary | ICD-10-CM

## 2022-10-14 DIAGNOSIS — M25571 Pain in right ankle and joints of right foot: Secondary | ICD-10-CM | POA: Diagnosis not present

## 2022-10-14 LAB — BASIC METABOLIC PANEL
BUN: 13 mg/dL (ref 6–23)
CO2: 27 mEq/L (ref 19–32)
Calcium: 9.1 mg/dL (ref 8.4–10.5)
Chloride: 106 mEq/L (ref 96–112)
Creatinine, Ser: 0.98 mg/dL (ref 0.40–1.50)
GFR: 88.03 mL/min (ref >60.00)
Glucose, Bld: 91 mg/dL (ref 70–99)
Potassium: 4.8 mEq/L (ref 3.5–5.1)
Sodium: 141 mEq/L (ref 135–145)

## 2022-10-14 LAB — URINALYSIS, ROUTINE W REFLEX MICROSCOPIC
Bilirubin Urine: NEGATIVE
Hgb urine dipstick: NEGATIVE
Ketones, ur: NEGATIVE
Leukocytes,Ua: NEGATIVE
Nitrite: NEGATIVE
Specific Gravity, Urine: >=1.03 — AB (ref 1.000–1.030)
Total Protein, Urine: NEGATIVE
Urine Glucose: NEGATIVE
Urobilinogen, UA: 4 — AB (ref 0.0–1.0)
pH: 6 (ref 5.0–8.0)

## 2022-10-14 NOTE — Progress Notes (Signed)
Established Patient Office Visit   Subjective:  Patient ID: Chad Hernandez, male    DOB: [birth year]  Age: 52 y.o. MRN: [id number]  Chief Complaint  Patient presents with   Advice Only    Discuss labs, check swollen right ankle very painful at times.    For follow-up of recent labs that revealed hyperkalemia and an elevated hemoglobin A1c.  He has been experiencing right ankle pain over the last several days.  He is status post total ankle arthroplasty in 2021.  It is doing well for him. HPI Encounter Diagnoses  Name Primary?   Prediabetes Yes   Screening for HIV (human immunodeficiency virus)    H/O total ankle replacement, right    Right ankle pain, unspecified chronicity    Hyperkalemia    For follow-up of recent lab work that revealed   Review of Systems  Constitutional: Negative.  Negative for weight loss.  HENT: Negative.    Eyes:  Negative for blurred vision, discharge and redness.  Respiratory: Negative.    Cardiovascular: Negative.   Gastrointestinal:  Negative for abdominal pain.  Genitourinary: Negative.  Negative for frequency.  Musculoskeletal:  Positive for joint pain. Negative for myalgias.  Skin:  Negative for rash.  Neurological:  Negative for tingling, loss of consciousness and weakness.  Endo/Heme/Allergies:  Negative for polydipsia.    No current outpatient medications on file.   Objective:     BP 136/82 (BP Location: Right Arm, Patient Position: Sitting, Cuff Size: Large)   Pulse 77   Temp 98.6 F (37 C) (Temporal)   Ht 6\' 1"  (1.854 m)   Wt 234 lb (106.1 kg)   SpO2 98%   BMI 30.87 kg/m    Physical Exam Constitutional:      General: He is not in acute distress.    Appearance: Normal appearance. He is not ill-appearing, toxic-appearing or diaphoretic.  HENT:     Head: Normocephalic and atraumatic.     Right Ear: External ear normal.     Left Ear: External ear normal.  Eyes:     General: No scleral icterus.       Right eye: No  discharge.        Left eye: No discharge.     Extraocular Movements: Extraocular movements intact.     Conjunctiva/sclera: Conjunctivae normal.  Pulmonary:     Effort: Pulmonary effort is normal. No respiratory distress.  Skin:    General: Skin is warm and dry.  Neurological:     Mental Status: He is alert and oriented to person, place, and time.  Psychiatric:        Mood and Affect: Mood normal.        Behavior: Behavior normal.      No results found for any visits on 10/14/22.    The 10-year ASCVD risk score (Arnett DK, et al., 2019) is: 6.5%    Assessment & Plan:   Prediabetes -     Urinalysis, Routine w reflex microscopic -     Basic metabolic panel  Screening for HIV (human immunodeficiency virus) -     HIV Antibody (routine testing w rflx)  H/O total ankle replacement, right -     Ambulatory referral to Orthopedics  Right ankle pain, unspecified chronicity -     Ambulatory referral to Orthopedics  Hyperkalemia -     Basic metabolic panel    Return in about 6 months (around 04/14/2023), or if symptoms worsen or fail to improve.  Patient would  like to increase physical activity and lose weight for treatment of prediabetes.  Information was given on preventing diabetes.  Follow-up in 6 months.  Libby Maw, MD

## 2022-10-15 LAB — HIV ANTIBODY (ROUTINE TESTING W REFLEX): HIV 1&2 Ab, 4th Generation: NONREACTIVE

## 2022-12-27 ENCOUNTER — Encounter: Payer: Self-pay | Admitting: *Deleted

## 2022-12-31 ENCOUNTER — Ambulatory Visit: Payer: No Typology Code available for payment source | Admitting: Family Medicine

## 2023-03-23 ENCOUNTER — Other Ambulatory Visit: Payer: Self-pay | Admitting: Family Medicine

## 2023-04-14 ENCOUNTER — Encounter: Payer: Self-pay | Admitting: Family Medicine

## 2023-04-14 ENCOUNTER — Ambulatory Visit: Payer: No Typology Code available for payment source | Admitting: Family Medicine

## 2023-04-14 VITALS — BP 132/82 | HR 62 | Temp 97.4°F | Ht 73.0 in | Wt 227.2 lb

## 2023-04-14 DIAGNOSIS — R7303 Prediabetes: Secondary | ICD-10-CM

## 2023-04-14 DIAGNOSIS — Z1211 Encounter for screening for malignant neoplasm of colon: Secondary | ICD-10-CM

## 2023-04-14 LAB — HEMOGLOBIN A1C: Hgb A1c MFr Bld: 6.1 % (ref 4.6–6.5)

## 2023-04-14 NOTE — Progress Notes (Signed)
   Established Patient Office Visit   Subjective:  Patient ID: Chad Hernandez, male    DOB: November 08, 1970  Age: 52 y.o. MRN: [id number]  Chief Complaint  Patient presents with   Medical Management of Chronic Issues    6 month follow up.     HPI Encounter Diagnoses  Name Primary?   Prediabetes Yes   Screening for colon cancer    For follow-up of the above.  Signals were crossed and never mated) for colonoscopy consult.  Has been able to lose some weight.  Family history of diabetes on both sides.  Has been exercising and staying active.   Review of Systems  Constitutional: Negative.   HENT: Negative.    Eyes:  Negative for blurred vision, discharge and redness.  Respiratory: Negative.    Cardiovascular: Negative.   Gastrointestinal:  Negative for abdominal pain.  Genitourinary: Negative.   Musculoskeletal: Negative.  Negative for myalgias.  Skin:  Negative for rash.  Neurological:  Negative for tingling, loss of consciousness and weakness.  Endo/Heme/Allergies:  Negative for polydipsia.    No current outpatient medications on file.   Objective:     BP 132/82   Pulse 62   Temp (!) 97.4 F (36.3 C)   Ht 6\' 1"  (1.854 m)   Wt 227 lb 3.2 oz (103.1 kg)   SpO2 97%   BMI 29.98 kg/m  Wt Readings from Last 3 Encounters:  04/14/23 227 lb 3.2 oz (103.1 kg)  10/14/22 234 lb (106.1 kg)  10/01/22 232 lb (105.2 kg)      Physical Exam Constitutional:      General: He is not in acute distress.    Appearance: Normal appearance. He is not ill-appearing, toxic-appearing or diaphoretic.  HENT:     Head: Normocephalic and atraumatic.     Right Ear: External ear normal.     Left Ear: External ear normal.  Eyes:     General: No scleral icterus.       Right eye: No discharge.        Left eye: No discharge.     Extraocular Movements: Extraocular movements intact.     Conjunctiva/sclera: Conjunctivae normal.  Pulmonary:     Effort: Pulmonary effort is normal. No respiratory  distress.  Skin:    General: Skin is warm and dry.  Neurological:     Mental Status: He is alert and oriented to person, place, and time.  Psychiatric:        Mood and Affect: Mood normal.        Behavior: Behavior normal.      No results found for any visits on 04/14/23.    The 10-year ASCVD risk score (Arnett DK, et al., 2019) is: 6.5%    Assessment & Plan:   Prediabetes -     Hemoglobin A1c  Screening for colon cancer -     Ambulatory referral to Gastroenterology    Return in about 6 months (around 10/15/2023), or if symptoms worsen or fail to improve.  Continue active healthy lifestyle.  Information on prediabetes was given.  Mliss Sax, MD

## 2023-05-30 ENCOUNTER — Encounter: Payer: Self-pay | Admitting: Internal Medicine

## 2023-06-17 ENCOUNTER — Other Ambulatory Visit: Payer: Self-pay | Admitting: Family Medicine

## 2023-06-17 DIAGNOSIS — Z1212 Encounter for screening for malignant neoplasm of rectum: Secondary | ICD-10-CM

## 2023-06-17 DIAGNOSIS — Z1211 Encounter for screening for malignant neoplasm of colon: Secondary | ICD-10-CM

## 2023-06-22 ENCOUNTER — Encounter: Payer: Self-pay | Admitting: Internal Medicine

## 2023-06-22 ENCOUNTER — Ambulatory Visit (AMBULATORY_SURGERY_CENTER): Payer: No Typology Code available for payment source | Admitting: *Deleted

## 2023-06-22 VITALS — Ht 73.0 in | Wt 235.0 lb

## 2023-06-22 DIAGNOSIS — Z1211 Encounter for screening for malignant neoplasm of colon: Secondary | ICD-10-CM

## 2023-06-22 MED ORDER — NA SULFATE-K SULFATE-MG SULF 17.5-3.13-1.6 GM/177ML PO SOLN: 1.0000 | Freq: Once | ORAL | 0 refills | Status: AC

## 2023-06-22 NOTE — Progress Notes (Signed)
Pt's name and DOB verified at the beginning of the pre-visit wit 2 identifiers  Pt denies any difficulty with ambulating,sitting, laying down or rolling side to side  Gave both [facility] main # and MD on call # prior to instructions.   No egg or soy allergy known to patient   No issues known to pt with past sedation with any surgeries or procedures  Pt denies having issues being intubated  Patient denies ever being intubated  Pt has no issues moving head neck or swallowing  No FH of Malignant Hyperthermia  Pt is not on diet pills or shots  Pt is not on home 02   Pt is not on blood thinners   Pt denies issues with constipation    Pt is not on dialysis  Pt denise any abnormal heart rhythms   Pt denies any upcoming cardiac testing  Pt encouraged to use to use Singlecare or Goodrx to reduce cost   Patient's chart reviewed by Chad Hernandez CNRA prior to pre-visit and patient appropriate for the [facility].  Pre-visit completed and red dot placed by patient's name on their procedure day (on provider's schedule).  .  Visit by phone  Pt states weight is 235 lb  Instructed pt why it is important to and  to call if they have any changes in health or new medications. Directed them to the # given and on instructions.     Instructions reviewed with pt and pt states understanding. Instructed to review again prior to procedure. Pt states they will.   Instructions sent by mail with coupon and by my chart

## 2023-07-12 NOTE — Progress Notes (Unsigned)
[facility] History and Physical   Primary Care Physician:  Mliss Sax, MD   Reason for Procedure:   CRCA screening  Plan:    colonoscopy     HPI: Chad Hernandez is a 52 y.o. male here for screening colonoscopy exam   Past Medical History:  Diagnosis Date   Arthritis    Closed nondisp fracture of right medial malleolus with routine healing    GERD (gastroesophageal reflux disease)    Sleep apnea    mild no cpap     Past Surgical History:  Procedure Laterality Date   EYE SURGERY     HAND SURGERY     ORIF ANKLE FRACTURE Right 06/19/2020   Procedure: Open Reduction Internal Fixation (ORIF) Right medial malleolus fracture;  Surgeon: Toni Arthurs, MD;  Location: [facility];  Service: Orthopedics;  Laterality: Right;   TOTAL ANKLE ARTHROPLASTY Right 04/10/2020   Procedure: TOTAL ANKLE ARTHROPLASTY;  Surgeon: Toni Arthurs, MD;  Location: [facility];  Service: Orthopedics;  Laterality: Right;   TOTAL HIP ARTHROPLASTY Left 02/03/2021   Procedure: TOTAL HIP ARTHROPLASTY ANTERIOR APPROACH;  Surgeon: Durene Romans, MD;  Location: [facility];  Service: Orthopedics;  Laterality: Left;    TOTAL HIP ARTHROPLASTY Right 07/28/2021   Procedure: TOTAL HIP ARTHROPLASTY ANTERIOR APPROACH;  Surgeon: Durene Romans, MD;  Location: [facility];  Service: Orthopedics;  Laterality: Right;    Prior to Admission medications   Not on File    No current outpatient medications on file.   No current facility-administered medications for this visit.    Allergies as of 07/13/2023   (No Known Allergies)    Family History  Problem Relation Age of Onset   Hyperlipidemia Mother    Hypertension Mother    Breast cancer Mother    Hypertension Brother    Breast cancer Maternal Grandmother    Diabetes Neg Hx    Heart attack Neg Hx    Sudden death Neg Hx    Cancer Neg Hx    COPD Neg Hx    Early death Neg Hx    Hearing loss Neg Hx    Heart  disease Neg Hx    Kidney disease Neg Hx    Stroke Neg Hx    Colon cancer Neg Hx    Colon polyps Neg Hx    Rectal cancer Neg Hx    Stomach cancer Neg Hx    Esophageal cancer Neg Hx     Social History   Socioeconomic History   Marital status: Married    Spouse name: Not on file   Number of children: Not on file   Years of education: Not on file   Highest education level: Not on file  Occupational History   Not on file  Tobacco Use   Smoking status: Some Days    Types: Cigars   Smokeless tobacco: Never   Tobacco comments:    occassionally smokes cigars  Vaping Use   Vaping status: Never Used  Substance and Sexual Activity   Alcohol use: Yes    Alcohol/week: 3.0 standard drinks of alcohol    Types: 3 Cans of beer per week    Comment: occassional   Drug use: No   Sexual activity: Yes  Other Topics Concern   Not on file  Social History Narrative   Not on file   Social Determinants of Health   Financial Resource Strain: Not on file  Food Insecurity: Not on file  Transportation  Needs: Not on file  Physical Activity: Not on file  Stress: Not on file  Social Connections: Not on file  Intimate Partner Violence: Not on file    Review of Systems: Positive for *** All other review of systems negative except as mentioned in the HPI.  Physical Exam: Vital signs There were no vitals taken for this visit.  General:   Alert,  Well-developed, well-nourished, pleasant and cooperative in NAD Lungs:  Clear throughout to auscultation.   Heart:  Regular rate and rhythm; no murmurs, clicks, rubs,  or gallops. Abdomen:  Soft, nontender and nondistended. Normal bowel sounds.   Neuro/Psych:  Alert and cooperative. Normal mood and affect. A and O x 3   @Kelan Pritt  Sena Slate, MD, [facility] [phone] (pager) 07/12/2023 7:08 PM@

## 2023-07-13 ENCOUNTER — Encounter: Payer: Self-pay | Admitting: Internal Medicine

## 2023-07-13 ENCOUNTER — Ambulatory Visit (AMBULATORY_SURGERY_CENTER): Payer: No Typology Code available for payment source | Admitting: Internal Medicine

## 2023-07-13 VITALS — BP 128/93 | HR 78 | Temp 98.0°F | Resp 18 | Ht 73.0 in | Wt 235.0 lb

## 2023-07-13 DIAGNOSIS — G4730 Sleep apnea, unspecified: Secondary | ICD-10-CM | POA: Diagnosis not present

## 2023-07-13 DIAGNOSIS — Z1211 Encounter for screening for malignant neoplasm of colon: Secondary | ICD-10-CM

## 2023-07-13 MED ORDER — SODIUM CHLORIDE 0.9 % IV SOLN: 500.0000 mL | INTRAVENOUS | Status: DC

## 2023-07-13 NOTE — Patient Instructions (Signed)
Resume previous diet. Continue present medications. Repeat colonoscopy in 10 years for screening purposes.   YOU HAD AN ENDOSCOPIC PROCEDURE TODAY AT THE [facility]:   Refer to the procedure report that was given to you for any specific questions about what was found during the examination.  If the procedure report does not answer your questions, please call your gastroenterologist to clarify.  If you requested that your care partner not be given the details of your procedure findings, then the procedure report has been included in a sealed envelope for you to review at your convenience later.  YOU SHOULD EXPECT: Some feelings of bloating in the abdomen. Passage of more gas than usual.  Walking can help get rid of the air that was put into your GI tract during the procedure and reduce the bloating. If you had a lower endoscopy (such as a colonoscopy or flexible sigmoidoscopy) you may notice spotting of blood in your stool or on the toilet paper. If you underwent a bowel prep for your procedure, you may not have a normal bowel movement for a few days.  Please Note:  You might notice some irritation and congestion in your nose or some drainage.  This is from the oxygen used during your procedure.  There is no need for concern and it should clear up in a day or so.  SYMPTOMS TO REPORT IMMEDIATELY:  Following lower endoscopy (colonoscopy or flexible sigmoidoscopy):  Excessive amounts of blood in the stool  Significant tenderness or worsening of abdominal pains  Swelling of the abdomen that is new, acute  Fever of 100F or higher  For urgent or emergent issues, a gastroenterologist can be reached at any hour by calling [phone]. Do not use MyChart messaging for urgent concerns.    DIET:  We do recommend a small meal at first, but then you may proceed to your regular diet.  Drink plenty of fluids but you should avoid alcoholic beverages for 24 hours.  ACTIVITY:  You should plan  to take it easy for the rest of today and you should NOT DRIVE or use heavy machinery until tomorrow (because of the sedation medicines used during the test).    FOLLOW UP: Our staff will call the number listed on your records the next business day following your procedure.  We will call around 7:15- 8:00 am to check on you and address any questions or concerns that you may have regarding the information given to you following your procedure. If we do not reach you, we will leave a message.     If any biopsies were taken you will be contacted by phone or by letter within the next 1-3 weeks.  Please call us at [phone] if you have not heard about the biopsies in 3 weeks.    SIGNATURES/CONFIDENTIALITY: You and/or your care partner have signed paperwork which will be entered into your electronic medical record.  These signatures attest to the fact that that the information above on your After Visit Summary has been reviewed and is understood.  Full responsibility of the confidentiality of this discharge information lies with you and/or your care-partner. 

## 2023-07-13 NOTE — Progress Notes (Signed)
Sedate, gd SR, tolerated procedure well, VSS, report to RN 

## 2023-07-13 NOTE — Op Note (Signed)
[facility] Patient Name: Chad Hernandez Procedure Date: 07/13/2023 8:39 AM MRN: [id number] Endoscopist: Iva Boop , MD, [id number] Age: 52 Referring MD:  Date of Birth: 05/31/1971 Gender: Male Account #: [id number] Procedure:                Colonoscopy Indications:              Screening for colorectal malignant neoplasm, This                            is the patient's first colonoscopy Medicines:                Monitored Anesthesia Care Procedure:                Pre-Anesthesia Assessment:                           - Prior to the procedure, a History and Physical                            was performed, and patient medications and                            allergies were reviewed. The patient's tolerance of                            previous anesthesia was also reviewed. The risks                            and benefits of the procedure and the sedation                            options and risks were discussed with the patient.                            All questions were answered, and informed consent                            was obtained. Prior Anticoagulants: The patient has                            taken no anticoagulant or antiplatelet agents. ASA                            Grade Assessment: II - A patient with mild systemic                            disease. After reviewing the risks and benefits,                            the patient was deemed in satisfactory condition to                            undergo the procedure.  After obtaining informed consent, the colonoscope                            was passed under direct vision. Throughout the                            procedure, the patient's blood pressure, pulse, and                            oxygen saturations were monitored continuously. The                            Olympus Scope SN: [id number] was introduced through                            the anus and advanced  to the the cecum, identified                            by appendiceal orifice and ileocecal valve. The                            colonoscopy was performed without difficulty. The                            patient tolerated the procedure well. The quality                            of the bowel preparation was good. The ileocecal                            valve and the rectum were photographed. Appendiceal                            orifice photo omitted. The bowel preparation used                            was SUPREP via split dose instruction. Scope In: 8:46:05 AM Scope Out: 8:58:10 AM Scope Withdrawal Time: 0 hours 10 minutes 16 seconds  Total Procedure Duration: 0 hours 12 minutes 5 seconds  Findings:                 The perianal and digital rectal examinations were                            normal. Pertinent negatives include normal prostate                            (size, shape, and consistency).                           The entire examined colon appeared normal on direct                            and retroflexion views. Complications:  No immediate complications. Estimated Blood Loss:     Estimated blood loss: none. Impression:               - The entire examined colon is normal on direct and                            retroflexion views.                           - No specimens collected. Recommendation:           - Patient has a contact number available for                            emergencies. The signs and symptoms of potential                            delayed complications were discussed with the                            patient. Return to normal activities tomorrow.                            Written discharge instructions were provided to the                            patient.                           - Resume previous diet.                           - Continue present medications.                           - Repeat colonoscopy in 10 years for  screening                            purposes. Iva Boop, MD 07/13/2023 9:08:35 AM This report has been signed electronically.

## 2023-07-14 ENCOUNTER — Telehealth: Payer: Self-pay | Admitting: *Deleted

## 2023-07-14 NOTE — Telephone Encounter (Signed)
  Follow up Call-     07/13/2023    7:54 AM  Call back number  Post procedure Call Back phone  # [phone]  Permission to leave phone message Yes     Patient questions:  Do you have a fever, pain , or abdominal swelling? No. Pain Score  0 *  Have you tolerated food without any problems? Yes.    Have you been able to return to your normal activities? Yes.    Do you have any questions about your discharge instructions: Diet   No. Medications  No. Follow up visit  No.  Do you have questions or concerns about your Care? No.  Actions: * If pain score is 4 or above: No action needed, pain <4.

## 2023-10-17 ENCOUNTER — Ambulatory Visit: Payer: No Typology Code available for payment source | Admitting: Family Medicine

## 2023-10-17 ENCOUNTER — Encounter: Payer: Self-pay | Admitting: Family Medicine

## 2023-10-17 VITALS — BP 142/84 | HR 91 | Temp 97.9°F | Ht 73.0 in | Wt 230.0 lb

## 2023-10-17 DIAGNOSIS — R1319 Other dysphagia: Secondary | ICD-10-CM | POA: Diagnosis not present

## 2023-10-17 DIAGNOSIS — R7303 Prediabetes: Secondary | ICD-10-CM

## 2023-10-17 DIAGNOSIS — R0683 Snoring: Secondary | ICD-10-CM | POA: Diagnosis not present

## 2023-10-17 DIAGNOSIS — R030 Elevated blood-pressure reading, without diagnosis of hypertension: Secondary | ICD-10-CM

## 2023-10-17 DIAGNOSIS — K219 Gastro-esophageal reflux disease without esophagitis: Secondary | ICD-10-CM

## 2023-10-17 LAB — BASIC METABOLIC PANEL
BUN: 15 mg/dL (ref 6–23)
CO2: 26 meq/L (ref 19–32)
Calcium: 8.9 mg/dL (ref 8.4–10.5)
Chloride: 108 meq/L (ref 96–112)
Creatinine, Ser: 0.94 mg/dL (ref 0.40–1.50)
GFR: 91.89 mL/min (ref >60.00)
Glucose, Bld: 80 mg/dL (ref 70–99)
Potassium: 4 meq/L (ref 3.5–5.1)
Sodium: 142 meq/L (ref 135–145)

## 2023-10-17 LAB — HEMOGLOBIN A1C: Hgb A1c MFr Bld: 6.3 % (ref 4.6–6.5)

## 2023-10-17 MED ORDER — OMEPRAZOLE 20 MG PO CPDR: 20.0000 mg | DELAYED_RELEASE_CAPSULE | Freq: Every day | ORAL | 3 refills | Status: AC

## 2023-10-17 NOTE — Progress Notes (Signed)
Established Patient Office Visit   Subjective:  Patient ID: Chad Hernandez, male    DOB: 03-04-1971  Age: 53 y.o. MRN: [id number]  Chief Complaint  Patient presents with   Medical Management of Chronic Issues    6 month follow up. Pt is not fasting.     HPI Encounter Diagnoses  Name Primary?   Prediabetes Yes   Elevated BP without diagnosis of hypertension    Snores    Gastroesophageal reflux disease, unspecified whether esophagitis present    Esophageal dysphagia    For follow-up of above.  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  He does walk his dog daily for 20 minutes twice.  Markedly decreased cigar smoking to 1 every 2 to 3 months.  He snores loudly.  He and his wife sleep in different bedrooms.  He is having some problems with dysphagia.  He realizes mild indigestion.   Review of Systems  Constitutional: Negative.   HENT: Negative.    Eyes:  Negative for blurred vision, discharge and redness.  Respiratory: Negative.    Cardiovascular: Negative.   Gastrointestinal:  Positive for heartburn. Negative for abdominal pain.  Genitourinary: Negative.   Musculoskeletal: Negative.  Negative for myalgias.  Skin:  Negative for rash.  Neurological:  Negative for tingling, loss of consciousness and weakness.  Endo/Heme/Allergies:  Negative for polydipsia.     Current Outpatient Medications:    omeprazole (PRILOSEC) 20 MG capsule, Take 1 capsule (20 mg total) by mouth daily., Disp: 90 capsule, Rfl: 3   Objective:     BP (!) 142/84   Pulse 91   Temp 97.9 F (36.6 C)   Ht 6\' 1"  (1.854 m)   Wt 230 lb 0.3 oz (104.3 kg)   SpO2 98%   BMI 30.35 kg/m  BP Readings from Last 3 Encounters:  10/17/23 (!) 142/84  07/13/23 (!) 128/93  04/14/23 132/82   Wt Readings from Last 3 Encounters:  10/17/23 230 lb 0.3 oz (104.3 kg)  07/13/23 235 lb (106.6 kg)  06/22/23 235 lb (106.6 kg)      Physical Exam Constitutional:      General: He is not in  acute distress.    Appearance: Normal appearance. He is not ill-appearing, toxic-appearing or diaphoretic.  HENT:     Head: Normocephalic and atraumatic.     Right Ear: External ear normal.     Left Ear: External ear normal.     Mouth/Throat:     Mouth: Mucous membranes are moist.     Pharynx: Oropharynx is clear. No oropharyngeal exudate or posterior oropharyngeal erythema.   Eyes:     General: No scleral icterus.       Right eye: No discharge.        Left eye: No discharge.     Extraocular Movements: Extraocular movements intact.     Conjunctiva/sclera: Conjunctivae normal.     Pupils: Pupils are equal, round, and reactive to light.  Cardiovascular:     Rate and Rhythm: Normal rate and regular rhythm.  Pulmonary:     Effort: Pulmonary effort is normal. No respiratory distress.     Breath sounds: Normal breath sounds.  Abdominal:     General: Bowel sounds are normal.  Musculoskeletal:     Cervical back: No rigidity or tenderness.  Skin:    General: Skin is warm and dry.  Neurological:     Mental Status: He is alert and oriented to person, place, and time.  Psychiatric:        Mood and Affect: Mood normal.        Behavior: Behavior normal.      No results found for any visits on 10/17/23.    The ASCVD Risk score (Arnett DK, et al., 2019) failed to calculate for the following reasons:   The smoking status is invalid    Assessment & Plan:   Prediabetes -     Basic metabolic panel -     Hemoglobin A1c  Elevated BP without diagnosis of hypertension -     Basic metabolic panel  Snores -     Ambulatory referral to Pulmonology  Gastroesophageal reflux disease, unspecified whether esophagitis present -     Omeprazole; Take 1 capsule (20 mg total) by mouth daily.  Dispense: 90 capsule; Refill: 3  Esophageal dysphagia    Return in about 6 months (around 04/15/2024), or Please exercise for 30 minutes daily and try to lose 10 to 20 pounds..  Emphasized the  importance of regular exercise for 30 minutes daily and weight loss to prevent progression of prediabetes to diabetes and elevated blood pressure to hypertension.  Advised to avoid all sugary drinks and moderate sweet intake.  His wife is a diabetic and he realizes he could assume dietary habits.  Agrees to go for a sleep study.  He rarely if ever smokes cigars.  Advised to quit.  Information given on preventing hypertension and diabetes.  Information was given on GERD.  He will start omeprazole 20 mg daily.  Information given on coping with quitting smoking.  Mliss Sax, MD

## 2024-01-02 ENCOUNTER — Encounter: Payer: Self-pay | Admitting: Adult Health

## 2024-01-02 ENCOUNTER — Ambulatory Visit: Payer: No Typology Code available for payment source | Admitting: Adult Health

## 2024-02-24 ENCOUNTER — Encounter: Payer: Self-pay | Admitting: Family Medicine

## 2024-02-24 ENCOUNTER — Ambulatory Visit: Admitting: Family Medicine

## 2024-02-24 VITALS — BP 122/82 | HR 77 | Temp 97.6°F | Ht 73.0 in | Wt 227.4 lb

## 2024-02-24 DIAGNOSIS — M7552 Bursitis of left shoulder: Secondary | ICD-10-CM

## 2024-02-24 DIAGNOSIS — L84 Corns and callosities: Secondary | ICD-10-CM | POA: Diagnosis not present

## 2024-02-24 DIAGNOSIS — R0681 Apnea, not elsewhere classified: Secondary | ICD-10-CM

## 2024-02-24 DIAGNOSIS — M7522 Bicipital tendinitis, left shoulder: Secondary | ICD-10-CM | POA: Diagnosis not present

## 2024-02-24 NOTE — Progress Notes (Signed)
 Established Patient Office Visit   Subjective:  Patient ID: Chad Hernandez, male    DOB: 06-10-1971  Age: 53 y.o. MRN: [id number]  Chief Complaint  Patient presents with   Abrasion    Cut right foot some months back in two different places but not healing  Has pain   Shoulder Pain    Left shoulder pain when lifting arm    Shoulder Pain  Pertinent negatives include no tingling.   Encounter Diagnoses  Name Primary?   Bursitis of left shoulder Yes   Biceps tendinitis of left upper extremity    Callus of heel    Apnea    Follow-up of above.  Ongoing left shoulder pain over the last few months.  It hurts to raise his arm.  No injury.  He is right-hand dominant.  Some over shoulder lifting at work.  Pain around calluses on his feet after he shaved them.  Sent for home sleep test but the equipment would not stay on his face.  Continues to snore loudly.  He is accompanied by his wife who says that she sleeps in a different room.   Review of Systems  Constitutional: Negative.   HENT: Negative.    Eyes:  Negative for blurred vision, discharge and redness.  Respiratory: Negative.    Cardiovascular: Negative.   Gastrointestinal:  Negative for abdominal pain.  Genitourinary: Negative.   Musculoskeletal:  Positive for joint pain. Negative for myalgias.  Skin:  Negative for rash.  Neurological:  Negative for tingling, loss of consciousness and weakness.  Endo/Heme/Allergies:  Negative for polydipsia.     Current Outpatient Medications:    omeprazole  (PRILOSEC) 20 MG capsule, Take 1 capsule (20 mg total) by mouth daily., Disp: 90 capsule, Rfl: 3   Objective:     BP 122/82 (BP Location: Left Arm, Patient Position: Sitting, Cuff Size: Normal)   Pulse 77   Temp 97.6 F (36.4 C) (Temporal)   Ht 6' 1 (1.854 m)   Wt 227 lb 6.4 oz (103.1 kg)   SpO2 99%   BMI 30.00 kg/m    Physical Exam Constitutional:      General: He is not in acute distress.    Appearance: Normal  appearance. He is not ill-appearing, toxic-appearing or diaphoretic.  HENT:     Head: Normocephalic and atraumatic.     Right Ear: External ear normal.     Left Ear: External ear normal.   Eyes:     General: No scleral icterus.       Right eye: No discharge.        Left eye: No discharge.     Extraocular Movements: Extraocular movements intact.     Conjunctiva/sclera: Conjunctivae normal.   Pulmonary:     Effort: Pulmonary effort is normal. No respiratory distress.   Musculoskeletal:     Left shoulder: Tenderness present. No deformity. Decreased range of motion. Normal strength.       Arms:   Skin:    General: Skin is warm and dry.       Neurological:     Mental Status: He is alert and oriented to person, place, and time.   Psychiatric:        Mood and Affect: Mood normal.        Behavior: Behavior normal.      No results found for any visits on 02/24/24.    The ASCVD Risk score (Arnett DK, et al., 2019) failed to calculate for the following reasons:  The smoking status is invalid    Assessment & Plan:   Bursitis of left shoulder -     Ambulatory referral to Sports Medicine  Biceps tendinitis of left upper extremity -     Ambulatory referral to Sports Medicine  Callus of heel -     Ambulatory referral to Podiatry  Apnea -     Pulmonary Visit    Return Has physical scheduled in August., for chronic disease follow-up, annual physical.  Podiatry referral for footcare.  Sports medicine referral for subacromial bursitis and biceps tendonitis.  Likely will need sleep lab for sleep study.  Tonna Frederic, MD

## 2024-03-07 NOTE — Progress Notes (Signed)
 03/08/24- 52 yoM for sleep evaluation courtesy of Dr Elsie Lent with concern of snoring, sleep apnea HST 11/06/18- AHI 6.8/hr, desat to 88%, body weight 236 lbs Epworth score-20 Body weight today- 220 lbs -----Snoring, apneas during sleep and easily falls asleep during the day. Discussed the use of AI scribe software for clinical note transcription with the patient, who gave verbal consent to proceed.  History of Present Illness   Chad Hernandez is a 53 year old male who presents with concerns about sleep apnea and loud snoring. He is accompanied by his wife. He was referred by Dr. Tess for evaluation of sleep apnea.  He underwent a home sleep study five years ago, but the reliability was uncertain. He continues to experience loud snoring, resulting in a 'sleep divorce' with his wife. He works as Production designer, theatre/television/film at [employer] goes to bed early, and wakes up early. He feels rested but naps on days off. He does not use sleep aids and consumes caffeinated beverages but can stay awake without them. His brother also has sleep apnea. He has no heart or lung problems. His wife observes frequent nodding off during meals and concerns about focus and memory, possibly related to sleep issues. He had tonsil out as a child. Denies heart or lung problems.     Prior to Admission medications   Medication Sig Start Date End Date Taking? Authorizing Provider  omeprazole  (PRILOSEC) 20 MG capsule Take 1 capsule (20 mg total) by mouth daily. Patient taking differently: Take 20 mg by mouth daily as needed (for reflux). 10/17/23  Yes Lent Elsie Sayre, MD      Past Medical History:  Diagnosis Date   Arthritis    Closed nondisp fracture of right medial malleolus with routine healing    GERD (gastroesophageal reflux disease)    Sleep apnea    mild no cpap    Past Surgical History:  Procedure Laterality Date   EYE SURGERY     HAND SURGERY     ORIF ANKLE FRACTURE Right 06/19/2020   Procedure: Open Reduction  Internal Fixation (ORIF) Right medial malleolus fracture;  Surgeon: Kit Rush, MD;  Location: [facility];  Service: Orthopedics;  Laterality: Right;   TOTAL ANKLE ARTHROPLASTY Right 04/10/2020   Procedure: TOTAL ANKLE ARTHROPLASTY;  Surgeon: Kit Rush, MD;  Location: [facility];  Service: Orthopedics;  Laterality: Right;   TOTAL HIP ARTHROPLASTY Left 02/03/2021   Procedure: TOTAL HIP ARTHROPLASTY ANTERIOR APPROACH;  Surgeon: Ernie Cough, MD;  Location: [facility];  Service: Orthopedics;  Laterality: Left;    TOTAL HIP ARTHROPLASTY Right 07/28/2021   Procedure: TOTAL HIP ARTHROPLASTY ANTERIOR APPROACH;  Surgeon: Ernie Cough, MD;  Location: [facility];  Service: Orthopedics;  Laterality: Right;   Family History  Problem Relation Age of Onset   Hyperlipidemia Mother    Hypertension Mother    Breast cancer Mother    Hypertension Brother    Breast cancer Maternal Grandmother    Diabetes Neg Hx    Heart attack Neg Hx    Sudden death Neg Hx    Cancer Neg Hx    COPD Neg Hx    Early death Neg Hx    Hearing loss Neg Hx    Heart disease Neg Hx    Kidney disease Neg Hx    Stroke Neg Hx    Colon cancer Neg Hx    Colon polyps Neg Hx    Rectal cancer Neg Hx    Stomach cancer Neg  Hx    Esophageal cancer Neg Hx    Social History   Socioeconomic History   Marital status: Married    Spouse name: Not on file   Number of children: Not on file   Years of education: Not on file   Highest education level: 12th grade  Occupational History   Not on file  Tobacco Use   Smoking status: Some Days    Types: Cigars   Smokeless tobacco: Never   Tobacco comments:    occassionally smokes cigars  Vaping Use   Vaping status: Never Used  Substance and Sexual Activity   Alcohol use: Yes    Alcohol/week: 3.0 standard drinks of alcohol    Types: 3 Cans of beer per week    Comment: occassional   Drug use: No   Sexual activity: Yes  Other Topics Concern   Not on  file  Social History Narrative   Not on file   Social Drivers of Health   Financial Resource Strain: Medium Risk (02/23/2024)   Overall Financial Resource Strain (CARDIA)    Difficulty of Paying Living Expenses: Somewhat hard  Food Insecurity: Food Insecurity Present (02/23/2024)   Hunger Vital Sign    Worried About Running Out of Food in the Last Year: Sometimes true    Ran Out of Food in the Last Year: Never true  Transportation Needs: No Transportation Needs (02/23/2024)   PRAPARE - Administrator, Civil Service (Medical): No    Lack of Transportation (Non-Medical): No  Physical Activity: Sufficiently Active (02/23/2024)   Exercise Vital Sign    Days of Exercise per Week: 5 days    Minutes of Exercise per Session: 60 min  Stress: No Stress Concern Present (02/23/2024)   [employer] of Occupational Health - Occupational Stress Questionnaire    Feeling of Stress: Only a little  Social Connections: Moderately Integrated (02/23/2024)   Social Connection and Isolation Panel    Frequency of Communication with Friends and Family: More than three times a week    Frequency of Social Gatherings with Friends and Family: Once a week    Attends Religious Services: 1 to 4 times per year    Active Member of [employer] or Organizations: No    Attends Engineer, structural: Not on file    Marital Status: Married  Catering manager Violence: Not on file   ROS-see HPI   Negative unless + Constitutional:    weight loss, night sweats, fevers, chills, fatigue, lassitude. HEENT:    headaches, difficulty swallowing, tooth/dental problems, sore throat,       sneezing, itching, ear ache, nasal congestion, post nasal drip, snoring CV:    chest pain, orthopnea, PND, swelling in lower extremities, anasarca,                                  dizziness, palpitations Resp:   shortness of breath with exertion or at rest.                productive cough,   non-productive cough, coughing up of  blood.              change in color of mucus.  wheezing.   Skin:    rash or lesions. GI:  No-   heartburn, indigestion, abdominal pain, nausea, vomiting, diarrhea,                 change in  bowel habits, loss of appetite GU: dysuria, change in color of urine, no urgency or frequency.   flank pain. MS:   joint pain, stiffness, decreased range of motion, back pain. Neuro-     nothing unusual Psych:  change in mood or affect.  depression or anxiety.   memory loss.  OBJ- Physical Exam General- Alert, Oriented, Affect-appropriate, Distress- none acute, +lean/ muscular Skin- rash-none, lesions- none, excoriation- none Lymphadenopathy- none Head- atraumatic            Eyes- Gross vision intact, PERRLA, conjunctivae and secretions clear            Ears- Hearing, canals-normal            Nose- Clear, no-Septal dev, mucus, polyps, erosion, perforation             Throat- Mallampati III , mucosa clear , drainage- none, tonsils- atrophic, +teeth Neck- flexible , trachea midline, no stridor , thyroid  nl, carotid no bruit Chest - symmetrical excursion , unlabored           Heart/CV- RRR , no murmur , no gallop  , no rub, nl s1 s2                           - JVD- none , edema- none, stasis changes- none, varices- none           Lung- clear to P&A, wheeze- none, cough- none , dullness-none, rub- none           Chest wall-  Abd-  Br/ Gen/ Rectal- Not done, not indicated Extrem- cyanosis- none, clubbing, none, atrophy- none, strength- nl Neuro- grossly intact to observation

## 2024-03-09 ENCOUNTER — Ambulatory Visit (INDEPENDENT_AMBULATORY_CARE_PROVIDER_SITE_OTHER): Admitting: Internal Medicine

## 2024-03-09 ENCOUNTER — Encounter: Payer: Self-pay | Admitting: Internal Medicine

## 2024-03-09 VITALS — BP 130/80 | HR 77 | Temp 98.2°F | Ht 73.0 in | Wt 227.8 lb

## 2024-03-09 DIAGNOSIS — G4733 Obstructive sleep apnea (adult) (pediatric): Secondary | ICD-10-CM | POA: Diagnosis not present

## 2024-03-09 DIAGNOSIS — F17290 Nicotine dependence, other tobacco product, uncomplicated: Secondary | ICD-10-CM

## 2024-03-09 NOTE — Patient Instructions (Signed)
 Order- schedule Split Night sleep study at sleep center   dx OSA  Please call us  about 2 weeks after your sleep study for results and recommendations

## 2024-04-06 ENCOUNTER — Ambulatory Visit: Admitting: Podiatry

## 2024-04-06 DIAGNOSIS — M79672 Pain in left foot: Secondary | ICD-10-CM | POA: Diagnosis not present

## 2024-04-06 DIAGNOSIS — B351 Tinea unguium: Secondary | ICD-10-CM

## 2024-04-06 DIAGNOSIS — M79671 Pain in right foot: Secondary | ICD-10-CM | POA: Diagnosis not present

## 2024-04-06 MED ORDER — TERBINAFINE HCL 250 MG PO TABS
250.0000 mg | ORAL_TABLET | Freq: Every day | ORAL | 0 refills | Status: AC
Start: 2024-04-06 — End: 2024-05-06

## 2024-04-06 NOTE — Progress Notes (Signed)
 Patient presents with complaint of dry cracked fissured skin around the right heel.  Has not noticed any drainage or infection at the moment although has had some clear drainage in the past also complains of nails being thick and dystrophic with discoloration.  Nails get painful more pain.  He works on his feet a lot.   Physical exam:  General appearance: Pleasant, and in no acute distress. AOx3.  Vascular: Pedal pulses: DP 2/4 bilaterally, PT 2/4 bilaterally.  Mild edema lower legs bilaterally. Capillary fill time immediate bilaterally.  Neurological: Light touch intact feet bilaterally.  Normal Achilles reflex bilaterally.  No clonus or spasticity noted.   Dermatologic:   Thick dystrophic discolored onychomycotic nails 1 through bilaterally with tenderness with pressure on the nail plates.  Skin normal temperature bilaterally.  Skin normal color, tone, and texture bilaterally.  Cracked fissured thick skin around the plantar medial aspect of the heel right.  No signs of bacterial infection  Musculoskeletal: Some tenderness on the plantar medial right plantar medial calcaneal tubercle..  No tenderness with lateral compression of the calcaneus.    Diagnosis: 1.  Painful onychomycosis 1 through 5 bilaterally 2.  Pain feet bilaterally 3.  Fissured skin and heel right  Plan: -New patient office visit for evaluation management for 3.   - Discussed with him the cracked fingers skin recommend using AmLactin and some greasy compound compound over it twice a day.  Also reviewed use of thick.  May have some plantar fasciitis 2.  Recommend wearing good supportive shoe also given some OTC insoles -Discussed onychomycosis and etiology and treatment.  Discussed topical versus oral compounds discussed risk and benefits of both.  Discussed possibly liver toxicity with Lamisil patient would like to pursue oral Lamisil treatment for onychomycosis.  No history of any liver or renal disease. -Rx Lamisil  250 mg 1 p.o. daily D 30 days - Orders for blood draw for labs LFTs -Discussed OTC orthotics feet bilaterally    Return 4 weeks Lamisil 2

## 2024-04-16 ENCOUNTER — Ambulatory Visit: Payer: No Typology Code available for payment source | Admitting: Family Medicine

## 2024-04-30 ENCOUNTER — Ambulatory Visit (HOSPITAL_BASED_OUTPATIENT_CLINIC_OR_DEPARTMENT_OTHER): Attending: Internal Medicine | Admitting: Internal Medicine

## 2024-04-30 DIAGNOSIS — G4733 Obstructive sleep apnea (adult) (pediatric): Secondary | ICD-10-CM | POA: Diagnosis present

## 2024-05-06 NOTE — Procedures (Signed)
 [facility] [address] [place], [place] [address] Tel: [phone]   Fax: [phone]  Polysomnography Interpretation  Patient Name:  Chad Hernandez, Chad Hernandez Date:  04/30/2024 Referring Physician:  REGGY SALT [phone]) %%startinterp%% Indications for Polysomnography The patient is a 53 year old Male who is 6' 1 and weighs 235.0 lbs. His BMI equals 31.1.  A full night polysomnogram was performed to evaluate for -.  No Medications  No Data.   Polysomnogram Data A full night polysomnogram recorded the standard physiologic parameters including EEG, EOG, EMG, EKG, nasal and oral airflow.  Respiratory parameters of chest and abdominal movements were recorded with Respiratory Inductance Plethysmography belts.  Oxygen saturation was recorded by pulse oximetry.   Sleep Architecture The total recording time of the polysomnogram was 418.4 minutes.  The total sleep time was 347.5 minutes.  The patient spent 8.3% of total sleep time in Stage N1, 61.9% in Stage N2, 0.0% in Stages N3, and 29.8% in REM.  Sleep latency was 17.6 minutes.  REM latency was 31.5 minutes.  Sleep Efficiency was 83.1%.  Wake after Sleep Onset time was 53.0 minutes.  Respiratory Events The polysomnogram revealed a presence of 1 obstructive, - central, and - mixed apneas resulting in an Apnea index of 0.2 events per hour.  There were 29 hypopneas (>=3% desaturation and/or arousal) resulting in an Apnea\Hypopnea Index (AHI >=3% desaturation and/or arousal) of 5.2 events per hour.  There were 8 hypopneas (>=4% desaturation) resulting in an Apnea\Hypopnea Index (AHI >=4% desaturation) of 1.6 events per hour.  There were 13 Respiratory Effort Related Arousals resulting in a RERA index of 2.2 events per hour. The Respiratory Disturbance Index is 7.4 events per hour.  The snore index was 405.6 events per hour.  Mean oxygen saturation was 94.3%.  The lowest oxygen saturation during sleep was  89.0%.  Time spent <=88% oxygen saturation was 0.1 minutes (-).  Limb Activity There were 4 total limb movements recorded, of this total, 4 were classified as PLMs.  PLM index was 0.7 per hour and PLM associated with Arousals index was 0.2 per hour.  Cardiac Summary The average pulse rate was 74.6 bpm.  The minimum pulse rate was 63.0 bpm while the maximum pulse rate was 103.0 bpm.  Cardiac rhythm was normal/abnormal.  Comments: Minimal obstructive sleep apnea, AHI(3%) 5.2/hr. Snoring with oxygen desaturation to a nadir of 89%, mean 94.3%.  Diagnosis: Obstructive sleep apnea- mild  Recommendations: Suggest conservative measures. Consider future re-study if results are inconsistent with clinical impression.   This study was personally reviewed and electronically signed by: Dr. REGGY SALT Accredited Board Certified in Sleep Medicine Date/Time: 05/06/24   3:18    %%endinterp%%   Diagnostic PSG Report  Patient Name: Chad Hernandez, Chad Hernandez Date: 04/30/2024  Date of Birth: 03/18/1971 Study Type: Split Night  Age: 53 year MRN #: [id number]  Sex: Male Interpreting Physician: SALT REGGY, [id number]  Height: 6' 1 Referring Physician: REGGY SALT [id number])  Weight: 235.0 lbs Recording Tech: Dewane Hacker CRT RPSGT RST  BMI: 31.1 Scoring Tech: Dewane Hacker CRT RPSGT RST  ESS: 19 Neck Size: 17.5   Study Overview  Lights Off: 10:08:46 PM  Count Index  Lights On: 05:07:11 AM Awakenings: 22 3.8  Time in Bed: 418.4 min. Arousals: 83 14.3  Total Sleep Time: 347.5 min. AHI (>=3% Desat and/or Ar.): 30 5.2   Sleep Efficiency: 83.1% AHI (>=4% Desat): 9 1.6   Sleep Latency: 17.6 min. Limb Movements: 4  0.7  Wake After Sleep Onset: 53.0 min. Snore: 2349 405.6  REM Latency from Sleep Onset: 31.5 min. Desaturations: 44 7.6     Minimum SpO2 TST: 89.0%    Sleep Architecture  % of Time in Bed Stages Time (mins) % Sleep Time  Wake 71.0   Stage N1 29.0 8.3%  Stage N2 215.0 61.9%  Stage N3 0.0  0.0%  REM 103.5 29.8%   Arousal Summary   NREM REM Sleep Index  Respiratory Arousals 6 7 13  2.2  PLM Arousals 1 - 1 0.2  Isolated Limb Movement Arousals - - - -  Snore Arousals 13 6 19  3.3  Spontaneous Arousals 37 13 50 8.6  Total 57 26 83 14.3   Limb Movement Summary   Count Index  Isolated Limb Movements - -  Periodic Limb Movements (PLMs) 4 0.7  Total Limb Movements 4 0.7    Respiratory Summary   By Sleep Stage By Body Position Total   NREM REM Supine Non-Supine   Time (min) 244.0 103.5 111.5 236.0 347.5         Obstructive Apnea 1 - 1 - 1  Mixed Apnea - - - - -  Central Apnea - - - - -  Total Apneas 1 - 1 - 1  Total Apnea Index 0.2 - 0.5 - 0.2         Hypopneas (>=3% Desat and/or Ar.) 3 26 23 6 29   AHI (>=3% Desat and/or Ar.) 1.0 15.1 12.9 1.5 5.2         Hypopneas (>=4% Desat) 1 7 6 2 8   AHI (>=4% Desat) 0.5 4.1 3.8 0.5 1.6          RERAs 7 6 4 9 13   RERA Index 1.7 3.5 2.2 2.3 2.2         RDI 2.7 18.6 15.1 3.8 7.4    Respiratory Event Type Index  Central Apneas -  Obstructive Apneas 0.2  Mixed Apneas -  Central Hypopneas -  Obstructive Hypopneas 5.0  Central Apnea + Hypopnea (CAHI) -  Obstructive Apnea + Hypopnea (OAHI) 5.2   Respiratory Event Durations   Apnea Hypopnea   NREM REM NREM REM  Average (seconds) 12.6 - 28.9 24.2  Maximum (seconds) 12.6 - 47.7 37.2    Oxygen Saturation Summary   Wake NREM REM TST TIB  Average SpO2 (%) 94.9% 94.0% 94.6% 94.2% 94.3%  Minimum SpO2 (%) 87.0% 89.0% 90.0% 89.0% 87.0%  Maximum SpO2 (%) 98.0% 98.0% 97.0% 98.0% 98.0%   Oxygen Saturation Distribution  Range (%) Time in range (min) Time in range (%)  90.0 - 100.0 413.4 99.9%  80.0 - 90.0 0.5 0.1%  70.0 - 80.0 - -  60.0 - 70.0 - -  50.0 - 60.0 - -  0.0 - 50.0 - -  Time Spent <=88% SpO2  Range (%) Time in range (min) Time in range (%)  0.0 - 88.0 0.1 0.0%      Count Index  Desaturations 44 7.6    Cardiac Summary   Wake NREM REM Sleep Total   Average Pulse Rate (BPM) 76.4 73.4 76.0 74.2 74.6  Minimum Pulse Rate (BPM) 67.0 63.0 66.0 63.0 63.0  Maximum Pulse Rate (BPM) 98.0 100.0 103.0 103.0 103.0   Pulse Rate Distribution:  Range (bpm) Time in range (min) Time in range (%)  0.0 - 40.0 - -  40.0 - 60.0 - -  60.0 - 80.0 388.8 93.8%  80.0 - 100.0 22.5 5.4%  100.0 -  120.0 0.1 0.0%  120.0 - 140.0 - -  140.0 - 200.0 - -      Hypnograms                      Technologist Comments  THE 74-YEAR-OLD MALE PATIENT PRESENTED TO THE SLEEP DISORDER CENTER FOR A SPLIT NIGHT STUDY WITH CHIEF COMPLAINTS OF OSA AND SNORING. NO BEDTIME MEDICATIONS WERE SELF ADMINISTERED. THE PATIENT WAS FITTED WITH A SMALL/ MEDIUM RESMED AIRFIT F40 FULL FACE MASK FOR PRE-CPAP TRIAL, WHICH WAS TOLERATED WELL. THE SPLIT NIGHT STUDY WAS EXPLAINED, THE LEAD PLACEMENT WAS INITIATED AND THEN THE STUDY WAS BEGUN. SUPPLEMENTAL OXYGEN WAS NOT WARRANTED DURING THE STUDY. MODERATE TO LOUD SNORING WAS NOTED THROUGHOUT THE STUDY. NO PLMs - PLMAs WERE NOTED. NO OBVIOUS CARDIAC ARRHYTHMIAS WERE OBSERVED. ONE RESTROOM VISIT WAS MADE. NO OBVIOUS PARASOMNIAS WERE OBSERVED. SPLIT NIGHT CRITERIA WAS NOT MET DUE TO INSUFFICIENT AHI, THEREFORE A NPSG DIAGNOSTIC STUDY WAS CONTINUED. THE PATIENT TOLERATED THE NPSG DIAGNOSTIC STUDY WELL.                           Reggy Salt Diplomate, Biomedical engineer of Sleep Medicine  ELECTRONICALLY SIGNED ON:  05/06/2024, 3:18 PM [facility] PH: [phone]   FX: [phone] ACCREDITED BY THE AMERICAN ACADEMY OF SLEEP MEDICINE

## 2024-05-06 NOTE — Procedures (Signed)
  Indications for Polysomnography The patient is a 53 year old Male who is 6' 1 and weighs 235.0 lbs. His BMI equals 31.1.  A full night polysomnogram was performed to evaluate for -.  No MedicationsNo Data. Polysomnogram Data A full night polysomnogram recorded the standard physiologic parameters including EEG, EOG, EMG, EKG, nasal and oral airflow.  Respiratory parameters of chest and abdominal movements were recorded with Respiratory Inductance Plethysmography belts.   Oxygen saturation was recorded by pulse oximetry.  Sleep Architecture The total recording time of the polysomnogram was 418.4 minutes.  The total sleep time was 347.5 minutes.  The patient spent 8.3% of total sleep time in Stage N1, 61.9% in Stage N2, 0.0% in Stages N3, and 29.8% in REM.  Sleep latency was 17.6 minutes.   REM latency was 31.5 minutes.  Sleep Efficiency was 83.1%.  Wake after Sleep Onset time was 53.0 minutes.  Respiratory Events The polysomnogram revealed a presence of 1 obstructive, - central, and - mixed apneas resulting in an Apnea index of 0.2 events per hour.  There were 29 hypopneas (GreaterEqual to3% desaturation and/or arousal) resulting in an Apnea\Hypopnea Index (AHI  GreaterEqual to3% desaturation and/or arousal) of 5.2 events per hour.  There were 8 hypopneas (GreaterEqual to4% desaturation) resulting in an Apnea\Hypopnea Index (AHI GreaterEqual to4% desaturation) of 1.6 events per hour.  There were 13 Respiratory  Effort Related Arousals resulting in a RERA index of 2.2 events per hour. The Respiratory Disturbance Index is 7.4 events per hour.  The snore index was 405.6 events per hour.  Mean oxygen saturation was 94.3%.  The lowest oxygen saturation during sleep was 89.0%.  Time spent LessEqual to88% oxygen saturation was  minutes ().  Limb Activity There were 4 total limb movements recorded, of this total, 4 were classified as PLMs.  PLM index was 0.7 per hour and PLM associated with Arousals  index was 0.2 per hour.  Cardiac Summary The average pulse rate was 74.6 bpm.  The minimum pulse rate was 63.0 bpm while the maximum pulse rate was 103.0 bpm.  Cardiac rhythm was normal/abnormal.  Comments:  Diagnosis:  Recommendations:   This study was personally reviewed and electronically signed by: Dr. Reggy Salt Accredited Board Certified in Sleep Medicine Date/Time:

## 2024-08-15 ENCOUNTER — Encounter (HOSPITAL_COMMUNITY): Payer: Self-pay

## 2024-08-15 ENCOUNTER — Ambulatory Visit (HOSPITAL_COMMUNITY)
Admission: RE | Admit: 2024-08-15 | Discharge: 2024-08-15 | Disposition: A | Discharge status: Home / Self Care | Source: Ambulatory Visit | Attending: Internal Medicine | Admitting: Internal Medicine

## 2024-08-15 ENCOUNTER — Ambulatory Visit (INDEPENDENT_AMBULATORY_CARE_PROVIDER_SITE_OTHER)

## 2024-08-15 VITALS — BP 139/83 | HR 76 | Temp 98.6°F | Resp 16

## 2024-08-15 DIAGNOSIS — M25512 Pain in left shoulder: Secondary | ICD-10-CM | POA: Diagnosis not present

## 2024-08-15 MED ORDER — DEXAMETHASONE SOD PHOSPHATE PF 10 MG/ML IJ SOLN
10.0000 mg | Freq: Once | INTRAMUSCULAR | Status: AC
  Administered 2024-08-15: 10 mg via INTRAMUSCULAR

## 2024-08-15 MED ORDER — METHYLPREDNISOLONE 4 MG PO TBPK: ORAL_TABLET | ORAL | 0 refills | Status: AC

## 2024-08-15 MED ORDER — KETOROLAC TROMETHAMINE 30 MG/ML IJ SOLN
INTRAMUSCULAR | Status: AC
  Filled 2024-08-15: qty 1

## 2024-08-15 MED ORDER — KETOROLAC TROMETHAMINE 30 MG/ML IJ SOLN
30.0000 mg | Freq: Once | INTRAMUSCULAR | Status: AC
  Administered 2024-08-15: 30 mg via INTRAMUSCULAR

## 2024-08-15 NOTE — ED Provider Notes (Signed)
 [facility]    CSN: [id number] Arrival date & time: 08/15/24  1757      History   Chief Complaint Chief Complaint  Patient presents with   APPT - shoulder pain    HPI Chad Hernandez is a 53 y.o. male.   53 year old male who presents urgent care with complaints of left shoulder pain, decreased range of motion and shooting pain into the left arm.  He reports he has been having trouble with his left shoulder for about a year.  He did discuss this with his primary care doctor who put in a referral for sports medicine but no one ever called him for this.  He reports that his shoulder had been at baseline for him with pain and decreased range of motion however in the last 3 days it has gotten much worse.  He is having severe shooting pains going down into the arm and sometimes feeling like it is coming from the arm up to the shoulder.  He has also felt his arm was weaker.  He cannot lift his arm above his head because it feels like it stops about halfway.  He denies any specific injury to the area.  He has not had any imaging done on his shoulder in the past.  He denies any chest pain or shortness of breath.     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   Status post THR (total hip replacement) 07/28/2021   S/P left total hip arthroplasty 02/03/2021   Osteoarthritis of left hip 01/12/2021   Obesity (BMI 30-39.9) 01/12/2021    Elevated BP without diagnosis of hypertension 04/29/2020   H/O total ankle replacement, 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   Snores 11/08/2018   Hypersomnia with sleep apnea 11/08/2018   Excessive daytime sleepiness 11/08/2018   OA (osteoarthritis) of ankle 03/28/2013   Ankle joint instability 03/28/2013   Screening for HIV (human immunodeficiency virus) 12/22/2012   Right ankle pain 04/07/2012    Past Surgical History:  Procedure Laterality Date   EYE SURGERY     HAND SURGERY     ORIF ANKLE FRACTURE Right 06/19/2020   Procedure: Open Reduction Internal Fixation (ORIF) Right medial malleolus fracture;  Surgeon: Kit Rush, MD;  Location: [facility];  Service: Orthopedics;  Laterality: Right;   TOTAL ANKLE ARTHROPLASTY Right 04/10/2020   Procedure: TOTAL ANKLE ARTHROPLASTY;  Surgeon: Kit Rush, MD;  Location: [facility];  Service: Orthopedics;  Laterality: Right;   TOTAL HIP ARTHROPLASTY Left 02/03/2021   Procedure: TOTAL HIP ARTHROPLASTY ANTERIOR APPROACH;  Surgeon: Ernie Cough, MD;  Location: [facility];  Service: Orthopedics;  Laterality: Left;    TOTAL HIP ARTHROPLASTY Right 07/28/2021   Procedure: TOTAL HIP ARTHROPLASTY ANTERIOR APPROACH;  Surgeon: Ernie Cough, MD;  Location: [facility];  Service: Orthopedics;  Laterality: Right;       Home Medications    Prior to Admission medications  Medication Sig Start Date End Date Taking? Authorizing Provider  methylPREDNISolone (MEDROL DOSEPAK) 4 MG TBPK tablet Follow package directions 08/15/24  Yes Teresa Almarie LABOR, PA-C  omeprazole  (PRILOSEC) 20 MG capsule Take 1 capsule (20 mg total) by mouth daily. Patient taking differently: Take 20 mg by mouth daily as needed (for reflux). 10/17/23   Berneta Elsie Sayre, MD    Family History Family History  Problem Relation Age of Onset   Hyperlipidemia Mother    Hypertension Mother    Breast cancer Mother    Hypertension Brother    Breast cancer Maternal Grandmother     Diabetes Neg Hx    Heart attack Neg Hx    Sudden death Neg Hx    Cancer Neg Hx    COPD Neg Hx    Early death Neg Hx    Hearing loss Neg Hx    Heart disease Neg Hx    Kidney disease Neg Hx    Stroke Neg Hx    Colon cancer Neg Hx    Colon polyps Neg Hx    Rectal cancer Neg Hx    Stomach cancer Neg Hx    Esophageal cancer Neg Hx     Social History Social History   Tobacco Use   Smoking status: Some Days    Types: Cigars   Smokeless tobacco: Never   Tobacco comments:    occassionally smokes cigars  Vaping Use   Vaping status: Never Used  Substance Use Topics   Alcohol use: Yes    Alcohol/week: 3.0 standard drinks of alcohol    Types: 3 Cans of beer per week    Comment: occassional   Drug use: No     Allergies   Patient has no known allergies.   Review of Systems Review of Systems  Constitutional:  Negative for chills and fever.  HENT:  Negative for ear pain and sore throat.   Eyes:  Negative for pain and visual disturbance.  Respiratory:  Negative for cough and shortness of breath.   Cardiovascular:  Negative for chest pain and palpitations.  Gastrointestinal:  Negative for abdominal pain and vomiting.  Genitourinary:  Negative for dysuria and hematuria.  Musculoskeletal:  Negative for arthralgias and back pain.       Left shoulder pain, decreased ROM, shooting pain into arm that is severe at times  Skin:  Negative for color change and rash.  Neurological:  Negative for seizures and syncope.  All other systems reviewed and are negative.    Physical Exam Triage Vital Signs ED Triage Vitals  Encounter Vitals Group     BP 08/15/24 1836 139/83     Girls Systolic BP Percentile --      Girls Diastolic BP Percentile --      Boys Systolic BP Percentile --      Boys Diastolic BP Percentile --      Pulse Rate 08/15/24 1836 76     Resp 08/15/24 1836 16     Temp 08/15/24 1836 98.6 F (37 C)     Temp Source 08/15/24 1836 Oral     SpO2 --      Weight --       Height --      Head Circumference --      Peak Flow --      Pain Score 08/15/24 1833 3     Pain Loc --      Pain Education --      Exclude from Growth Chart --  No data found.  Updated Vital Signs BP 139/83 (BP Location: Left Arm)   Pulse 76   Temp 98.6 F (37 C) (Oral)   Resp 16   Visual Acuity Right Eye Distance:   Left Eye Distance:   Bilateral Distance:    Right Eye Near:   Left Eye Near:    Bilateral Near:     Physical Exam Vitals and nursing note reviewed.  Constitutional:      General: He is not in acute distress.    Appearance: He is well-developed.  HENT:     Head: Normocephalic and atraumatic.  Eyes:     Conjunctiva/sclera: Conjunctivae normal.  Cardiovascular:     Rate and Rhythm: Normal rate and regular rhythm.     Heart sounds: No murmur heard. Pulmonary:     Effort: Pulmonary effort is normal. No respiratory distress.     Breath sounds: Normal breath sounds.  Abdominal:     Palpations: Abdomen is soft.     Tenderness: There is no abdominal tenderness.  Musculoskeletal:        General: No swelling.     Left shoulder: Tenderness (with movement) present. No swelling or deformity. Decreased range of motion (with elevation). Normal strength. Normal pulse.     Left wrist: Normal pulse.     Left hand: Normal capillary refill. Normal pulse.     Cervical back: Neck supple.  Skin:    General: Skin is warm and dry.     Capillary Refill: Capillary refill takes less than 2 seconds.  Neurological:     Mental Status: He is alert.  Psychiatric:        Mood and Affect: Mood normal.      UC Treatments / Results  Labs (all labs ordered are listed, but only abnormal results are displayed) Labs Reviewed - No data to display  EKG   Radiology DG Shoulder Left Result Date: 08/15/2024 CLINICAL DATA:  left shoulder pain for months with acute increase and decreased ROM in the last 3 days EXAM: LEFT SHOULDER - 2+ VIEW COMPARISON:  None Available. FINDINGS:  No acute fracture or dislocation. Moderate AC joint space loss with undersurface spurring, measuring 5 mm. Severe glenohumeral joint space loss with osteophyte formation. Soft tissues are unremarkable. IMPRESSION: 1. No acute fracture or dislocation. 2. Severe osteoarthritis of the glenohumeral joint. Moderate AC joint space loss with undersurface spurring. Electronically Signed   By: Rogelia Myers M.D.   On: 08/15/2024 19:17    Procedures Procedures (including critical care time)  Medications Ordered in UC Medications  ketorolac  (TORADOL ) 30 MG/ML injection 30 mg (has no administration in time range)  dexamethasone  (DECADRON ) injection 10 mg (has no administration in time range)    Initial Impression / Assessment and Plan / UC Course  I have reviewed the triage vital signs and the nursing notes.  Pertinent labs & imaging results that were available during my care of the patient were reviewed by me and considered in my medical decision making (see chart for details).     Acute pain of left shoulder - Plan: DG Shoulder Left, DG Shoulder Left   X-ray of the left shoulder done today.  Final evaluation with the radiologist does not show any acute findings however there is severe osteoarthritis present.  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  We will treat this with anti-inflammatories and recommend following up with orthopedics for further evaluation.  We have treated with the following: Toradol  injection given today.  This is a medication to help with pain. This is not a narcotic.  Decadron  injection given today. This is a steroid to help with inflammation and pain. Start 08/16/24: Medrol dose pack. Follow package insert.  Light stretching to improve mobility. May alternate heat and ice to help with symptoms.  Recommend contacting orthopedics and schedule a follow-up appointment for further evaluation Return to urgent care or PCP if symptoms worsen or fail to resolve.     Final Clinical Impressions(s) / UC Diagnoses   Final diagnoses:  Acute pain of left shoulder     Discharge Instructions      X-ray of the left shoulder done today.  Final evaluation with the radiologist does not show any acute findings however there is severe osteoarthritis present.  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  We will treat this with anti-inflammatories and recommend following up with orthopedics for further evaluation.  We have treated with the following: Toradol  injection given today. This is a medication to help with pain. This is not a narcotic.  Decadron  injection given today. This is a steroid to help with inflammation and pain. Start 08/16/24: Medrol dose pack. Follow package insert.  Light stretching to improve mobility. May alternate heat and ice to help with symptoms.  Recommend contacting orthopedics and schedule a follow-up appointment for further evaluation Return to urgent care or PCP if symptoms worsen or fail to resolve.       ED Prescriptions     Medication Sig Dispense Auth. Provider   methylPREDNISolone (MEDROL DOSEPAK) 4 MG TBPK tablet Follow package directions 1 each Teresa Almarie LABOR, PA-C      PDMP not reviewed this encounter.   Teresa Almarie LABOR, [place] 08/15/24 1951

## 2024-08-15 NOTE — Discharge Instructions (Addendum)
 X-ray of the left shoulder done today.  Final evaluation with the radiologist does not show any acute findings however there is severe osteoarthritis present.  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  We will treat this with anti-inflammatories and recommend following up with orthopedics for further evaluation.  We have treated with the following: Toradol  injection given today. This is a medication to help with pain. This is not a narcotic.  Decadron  injection given today. This is a steroid to help with inflammation and pain. Start 08/16/24: Medrol  dose pack. Follow package insert.  Do not take ibuprofen  while you are taking this medication.  It is okay to take Tylenol  Light stretching to improve mobility. May alternate heat and ice to help with symptoms.  Recommend contacting orthopedics and schedule a follow-up appointment for further evaluation Return to urgent care or PCP if symptoms worsen or fail to resolve.

## 2024-08-15 NOTE — ED Triage Notes (Signed)
 Pt c/o lt shoulder pain for over a year. States having shooting pain and decrease mobility x3 days. Denies injury or taken meds for sx's.

## 2024-09-10 ENCOUNTER — Encounter: Admitting: Pulmonary Disease

## 2024-09-10 DIAGNOSIS — G4733 Obstructive sleep apnea (adult) (pediatric): Secondary | ICD-10-CM

## 2024-09-10 NOTE — Progress Notes (Deleted)
 "  Synopsis: Referred in *** for *** by Berneta Elsie Sayre, MD  Subjective:   PATIENT ID: Chad Hernandez GENDER: male DOB: November 02, 1970, MRN: [id number]  No chief complaint on file.   HPI  Chad Hernandez is a 53 y.o. patient who presents today for ***  His PSG showed technically mild OSA (overall AHI3% only 5.2/hr) but with a REM-dominant pattern (REM AHI3% 15.1/hr vs NREM 1.0/hr) as well as supine-dominant pattern (supine AHI3% 12.9/hr vs non-supine 5.2/hr).  {Pulm [phone]}  Past Medical History:  Diagnosis Date   Arthritis    Closed nondisp fracture of right medial malleolus with routine healing    GERD (gastroesophageal reflux disease)    Sleep apnea    mild no cpap      Family History  Problem Relation Age of Onset   Hyperlipidemia Mother    Hypertension Mother    Breast cancer Mother    Hypertension Brother    Breast cancer Maternal Grandmother    Diabetes Neg Hx    Heart attack Neg Hx    Sudden death Neg Hx    Cancer Neg Hx    COPD Neg Hx    Early death Neg Hx    Hearing loss Neg Hx    Heart disease Neg Hx    Kidney disease Neg Hx    Stroke Neg Hx    Colon cancer Neg Hx    Colon polyps Neg Hx    Rectal cancer Neg Hx    Stomach cancer Neg Hx    Esophageal cancer Neg Hx      Past Surgical History:  Procedure Laterality Date   EYE SURGERY     HAND SURGERY     ORIF ANKLE FRACTURE Right 06/19/2020   Procedure: Open Reduction Internal Fixation (ORIF) Right medial malleolus fracture;  Surgeon: Kit Rush, MD;  Location: [facility];  Service: Orthopedics;  Laterality: Right;   TOTAL ANKLE ARTHROPLASTY Right 04/10/2020   Procedure: TOTAL ANKLE ARTHROPLASTY;  Surgeon: Kit Rush, MD;  Location: [facility];  Service: Orthopedics;  Laterality: Right;   TOTAL HIP ARTHROPLASTY Left 02/03/2021   Procedure: TOTAL HIP ARTHROPLASTY ANTERIOR APPROACH;  Surgeon: Ernie Cough, MD;  Location: [facility];  Service:  Orthopedics;  Laterality: Left;    TOTAL HIP ARTHROPLASTY Right 07/28/2021   Procedure: TOTAL HIP ARTHROPLASTY ANTERIOR APPROACH;  Surgeon: Ernie Cough, MD;  Location: [facility];  Service: Orthopedics;  Laterality: Right;    Social History   Socioeconomic History   Marital status: Married    Spouse name: Not on file   Number of children: Not on file   Years of education: Not on file   Highest education level: 12th grade  Occupational History   Not on file  Tobacco Use   Smoking status: Some Days    Types: Cigars   Smokeless tobacco: Never   Tobacco comments:    occassionally smokes cigars  Vaping Use   Vaping status: Never Used  Substance and Sexual Activity   Alcohol use: Yes    Alcohol/week: 3.0 standard drinks of alcohol    Types: 3 Cans of beer per week    Comment: occassional   Drug use: No   Sexual activity: Yes  Other Topics Concern   Not on file  Social History Narrative   Not on file   Social Drivers of Health   Tobacco Use: High Risk (08/15/2024)   Patient History    Smoking Tobacco Use: Some Days  Smokeless Tobacco Use: Never    Passive Exposure: Not on file  Financial Resource Strain: Medium Risk (02/23/2024)   Overall Financial Resource Strain (CARDIA)    Difficulty of Paying Living Expenses: Somewhat hard  Food Insecurity: Food Insecurity Present (02/23/2024)   Epic    Worried About Programme Researcher, Broadcasting/film/video in the Last Year: Sometimes true    Ran Out of Food in the Last Year: Never true  Transportation Needs: No Transportation Needs (02/23/2024)   Epic    Lack of Transportation (Medical): No    Lack of Transportation (Non-Medical): No  Physical Activity: Sufficiently Active (02/23/2024)   Exercise Vital Sign    Days of Exercise per Week: 5 days    Minutes of Exercise per Session: 60 min  Stress: No Stress Concern Present (02/23/2024)   [employer] of Occupational Health - Occupational Stress Questionnaire    Feeling of Stress: Only a little   Social Connections: Moderately Integrated (02/23/2024)   Social Connection and Isolation Panel    Frequency of Communication with Friends and Family: More than three times a week    Frequency of Social Gatherings with Friends and Family: Once a week    Attends Religious Services: 1 to 4 times per year    Active Member of [employer] or Organizations: No    Attends Engineer, Structural: Not on file    Marital Status: Married  Catering Manager Violence: Not on file  Depression (PHQ2-9): Low Risk (02/24/2024)   Depression (PHQ2-9)    PHQ-2 Score: 1  Alcohol Screen: Low Risk (02/23/2024)   Alcohol Screen    Last Alcohol Screening Score (AUDIT): 2  Housing: High Risk (02/23/2024)   Epic    Unable to Pay for Housing in the Last Year: Yes    Number of Times Moved in the Last Year: 0    Homeless in the Last Year: No  Utilities: Not on file  Health Literacy: Not on file     Allergies[1]   Outpatient Medications Prior to Visit  Medication Sig Dispense Refill   methylPREDNISolone  (MEDROL  DOSEPAK) 4 MG TBPK tablet Follow package directions 1 each 0   omeprazole  (PRILOSEC) 20 MG capsule Take 1 capsule (20 mg total) by mouth daily. (Patient taking differently: Take 20 mg by mouth daily as needed (for reflux).) 90 capsule 3   No facility-administered medications prior to visit.    ROS   Objective:  Physical Exam Constitutional:      Appearance: Normal appearance.  HENT:     Head: Normocephalic and atraumatic.     Mouth/Throat:     Mouth: Mucous membranes are moist.     Pharynx: Oropharynx is clear. No oropharyngeal exudate or posterior oropharyngeal erythema.  Eyes:     General: No scleral icterus.    Conjunctiva/sclera: Conjunctivae normal.  Cardiovascular:     Rate and Rhythm: Normal rate and regular rhythm.     Heart sounds: Normal heart sounds. No murmur heard.    No friction rub. No gallop.  Pulmonary:     Effort: Pulmonary effort is normal. No respiratory distress.      Breath sounds: Normal breath sounds. No stridor. No wheezing, rhonchi or rales.  Musculoskeletal:     Right lower leg: No edema.     Left lower leg: No edema.  Neurological:     Mental Status: He is alert and oriented to person, place, and time.  Psychiatric:        Thought Content: Thought content normal.  There were no vitals filed for this visit.   on *** LPM *** RA BMI Readings from Last 3 Encounters:  04/30/24 31.00 kg/m  03/09/24 30.05 kg/m  02/24/24 30.00 kg/m   Wt Readings from Last 3 Encounters:  04/30/24 235 lb (106.6 kg)  03/09/24 227 lb 12.8 oz (103.3 kg)  02/24/24 227 lb 6.4 oz (103.1 kg)     CBC    Component Value Date/Time   WBC 7.1 10/01/2022 1527   WBC 8.9 07/29/2021 0326   RBC 5.05 10/01/2022 1527   RBC 4.20 (L) 07/29/2021 0326   HGB 15.8 10/01/2022 1527   HCT 46.6 10/01/2022 1527   PLT 227 10/01/2022 1527   MCV 92 10/01/2022 1527   MCH 31.3 10/01/2022 1527   MCH 30.0 07/29/2021 0326   MCHC 33.9 10/01/2022 1527   MCHC 34.4 07/29/2021 0326   RDW 12.7 10/01/2022 1527   LYMPHSABS 1.9 12/22/2012 1051   MONOABS 0.7 12/22/2012 1051   EOSABS 0.1 12/22/2012 1051   BASOSABS 0.0 12/22/2012 1051    TSH 09/26/2018: 1.55 (within normal limits)  HbA1c 10/17/2023: 6.3% (borderline) 04/14/2023: 6.1%  Chest Imaging: ***  Pulmonary Functions Testing Results:     No data to display          FeNO: ***  Pathology: ***  Echocardiogram: ***  Heart Catheterization: ***  Sleep Studies: PSG (04/30/2024): Diagnosis of technically mild OSA (overall AHI3% only 5.2/hr) but with a REM-dominant pattern (REM AHI3% 15.1/hr vs NREM 1.0/hr) as well as supine-dominant pattern (supine AHI3% 12.9/hr vs non-supine 5.2/hr).       Assessment & Plan:     ICD-10-CM   1. OSA (obstructive sleep apnea)  G47.33       Discussion:  Patient has supine-dominant + REM-dominant OSA. His sleep apnea severity approaches the moderate range in the supine  position and is in the moderate range in REM (most of which was supine REM during his PSG).   Counseled patient on the somewhat stronger association of REM-dominant OSA with hypertension.  Since the patient has symptomatic EDS he does warrant treatment of his OSA. I counseled the patient that the correlation between EDS symptom severity and AHI is not linear, and that even mild OSA can cause severe EDS symptoms.  In terms of treatment options, I explained:  - CPAP is the treatment that offers the highest likelihood of symptomatic improvement if tolerated well.  - Other treatment options for him include positional therapy (side-sleeping) combined with a dental appliance for OSA.  - I counseled him that weight loss may be effective at significantly reducing his snoring and sleep apnea symptom burden. He is unfortunately not eligible for Zepbound therapy on the basis of his OSA as he does not have moderate or severe OSA overall.  - Finally, we discussed [employer]'s AD-109 which is expected to become available in the next 12-18 months.   Current Medications[2]   Time spent on day of this encounter (includes time spent face-to-face with the patient as well as time spent the same day as the encounter reviewing existing data and notes, and/or documenting my findings and the plan of care): *** minutes  Lamar Dales, MD Pulmonary, Critical Care & Sleep Medicine [facility] 09/10/2024 10:38 AM    [1] No Known Allergies [2]  Current Outpatient Medications:    methylPREDNISolone  (MEDROL  DOSEPAK) 4 MG TBPK tablet, Follow package directions, Disp: 1 each, Rfl: 0   omeprazole  (PRILOSEC) 20 MG capsule, Take 1 capsule (20 mg total)  by mouth daily. (Patient taking differently: Take 20 mg by mouth daily as needed (for reflux).), Disp: 90 capsule, Rfl: 3  "
